# Patient Record
Sex: Male | Born: 1980
Health system: Southern US, Community
[De-identification: ages and names within clinical notes are randomized; demographics above are authoritative.]

## PROBLEM LIST (undated history)

## (undated) DIAGNOSIS — G479 Sleep disorder, unspecified: Secondary | ICD-10-CM

## (undated) DIAGNOSIS — F419 Anxiety disorder, unspecified: Secondary | ICD-10-CM

## (undated) HISTORY — DX: Anxiety disorder, unspecified: F41.9

## (undated) HISTORY — DX: Sleep disorder, unspecified: G47.9

---

## 1999-02-15 ENCOUNTER — Encounter: Payer: Self-pay | Admitting: Internal Medicine

## 1999-02-15 ENCOUNTER — Emergency Department (HOSPITAL_COMMUNITY): Admission: EM | Admit: 1999-02-15 | Discharge: 1999-02-15 | Payer: Self-pay | Admitting: Internal Medicine

## 2000-12-31 ENCOUNTER — Emergency Department (HOSPITAL_COMMUNITY): Admission: EM | Admit: 2000-12-31 | Discharge: 2000-12-31 | Payer: Self-pay | Admitting: Emergency Medicine

## 2000-12-31 ENCOUNTER — Encounter: Payer: Self-pay | Admitting: Emergency Medicine

## 2005-02-23 ENCOUNTER — Emergency Department (HOSPITAL_COMMUNITY): Admission: EM | Admit: 2005-02-23 | Discharge: 2005-02-24 | Payer: Self-pay | Admitting: Emergency Medicine

## 2011-06-22 ENCOUNTER — Encounter: Payer: Self-pay | Admitting: Internal Medicine

## 2011-06-22 ENCOUNTER — Ambulatory Visit (INDEPENDENT_AMBULATORY_CARE_PROVIDER_SITE_OTHER): Payer: PRIVATE HEALTH INSURANCE | Admitting: Internal Medicine

## 2011-06-22 ENCOUNTER — Telehealth: Payer: Self-pay | Admitting: Internal Medicine

## 2011-06-22 VITALS — BP 138/82 | HR 74 | Ht 75.75 in | Wt 195.8 lb

## 2011-06-22 DIAGNOSIS — F5104 Psychophysiologic insomnia: Secondary | ICD-10-CM | POA: Insufficient documentation

## 2011-06-22 DIAGNOSIS — G4700 Insomnia, unspecified: Secondary | ICD-10-CM

## 2011-06-22 MED ORDER — CLONAZEPAM 0.5 MG PO TABS: ORAL_TABLET | ORAL | Status: DC

## 2011-06-22 NOTE — Patient Instructions (Signed)
Allow for exercise during day to burn off nervous energy.  Avoid caffeine after lunch Wind down in the evening as bedtime approaches- read, avoid bright lights, etc. Best sleeping conditions are usually in a quiet, dark, cool bedroom with a comfortable bed. If you really can't sleep and are getting frustrated, then get up, go into a different room and do something distracting until you feel ready to sleep again.   Consider an online source of instruction in cognitive behavioral therapy for sleep : cbtforinsomnia.com or Sleepio.com  Script for trial clonazepam.    Continue citalopram for now  Go ahead and check out the [employer] job.

## 2011-06-22 NOTE — Telephone Encounter (Signed)
Pt wanted to know if he needed to continue or d/c clorazepate. PER CY pt to d/c Clorazepate, continue Citalopram, and trial clonazepam. Pt verbalized understanding

## 2011-06-22 NOTE — Progress Notes (Signed)
Subjective:    Patient ID: Chad Burke, male    DOB: August 23, 1981, 31 y.o.   MRN: [id number]  HPI 06/22/11- 30 yo male never smoker seen for complaint of difficulty initiating and maintaining sleep, on the kind referral of Dr. Daisy Floro He says this has been a problem for 2 years without specific onset or triggering event and with no childhood or adolescent sleep problems initially he had difficulty falling asleep and then progressively over time began having more trouble maintaining sleep, regaining sleep once awake, and daytime tiredness. Now he wakes between 3 and 4 AM. Bedtime is 11 PM to 12 midnight. He may drink tea at lunch and dinner but not coffee. He feels he cannot nap, lies there but sleep will not come. Denies fighting sleepiness while driving. He has tried and failed temazepam, trazodone 3 mg Lunesta samples. He continues Citalopram and Tranxene. Never felt that anxiety was a problem. Does not think he has significant apnea limb jerks or other sleep disturbing behavior. Occasional social alcohol. Stopped smoking 2008. He is unmarried unemployed and living with his parents. 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 Family history the mother has some insomnia. Thyroid function has tested normal and he denies weight change palpitation or cream or.  Review of Systems Constitutional:   No-   weight loss, night sweats, fevers, chills, fatigue, lassitude. HEENT:   No-  headaches, difficulty swallowing, tooth/dental problems, sore throat,       No-  sneezing, itching, ear ache, nasal congestion, post nasal drip,  CV:  No-   chest pain, orthopnea, PND, swelling in lower extremities, anasarca, dizziness, palpitations Resp: No-   shortness of breath with exertion or at rest.              No-   productive cough,  No non-productive cough,  No-  coughing up of blood.              No-   change in color of  mucus.  No- wheezing.   Skin: No-   rash or lesions. GI:  No-   heartburn, indigestion, abdominal pain, nausea, vomiting, diarrhea,                 change in bowel habits, loss of appetite GU: No-   dysuria, change in color of urine, no urgency or frequency.  No- flank pain. MS:  No-   joint pain or swelling.  No- decreased range of motion.  No- back pain. Neuro- grossly normal to observation, Or:  Psych:  No- change in mood or affect. Mild depression or anxiety about work capacity if he doesn't get enough sleep..  No memory loss.      Objective:   Physical Exam General- Alert, Oriented, Affect-appropriate, Distress- none acute,    Tall, lean Skin- rash-none, lesions- none, excoriation- none Lymphadenopathy- none Head- atraumatic            Eyes- Gross vision intact, PERRLA, conjunctivae clear secretions            Ears- Hearing, canals normal            Nose- Clear, no-Septal dev, mucus, polyps, erosion, perforation             Throat- Mallampati II , mucosa clear , drainage- none, tonsils- atrophic Neck- flexible , trachea midline, no stridor , thyroid nl, carotid no bruit Chest - symmetrical excursion ,  unlabored           Heart/CV- RRR , no murmur , no gallop  , no rub, nl s1 s2                           - JVD- none , edema- none, stasis changes- none, varices- none           Lung- clear to P&A, wheeze- none, cough- none , dullness-none, rub- none           Chest wall-  Abd- tender-no, distended-no, bowel sounds-present, HSM- no Br/ Gen/ Rectal- Not done, not indicated Extrem- cyanosis- none, clubbing, none, atrophy- none, strength- nl Neuro- grossly intact to observation         Assessment & Plan:

## 2011-06-22 NOTE — Assessment & Plan Note (Addendum)
Chronic nonspecific insomnia with psychophysiologic features. We discussed and will direct him to a cbt site and try clonazepam.  Educated about good sleep hygiene.  He gets performance anxiety as bedtime comes close and in that sense may be his "worst enemy".

## 2011-06-23 ENCOUNTER — Encounter: Payer: Self-pay | Admitting: Internal Medicine

## 2011-06-28 ENCOUNTER — Telehealth: Payer: Self-pay | Admitting: Internal Medicine

## 2011-06-28 NOTE — Telephone Encounter (Signed)
Per CY-okay for patient to try taking up to 4 0.5mg  tablets of clonazepam for sleep at night if needed. Pt is aware and will call back if any troubles.

## 2011-06-28 NOTE — Telephone Encounter (Addendum)
Called and spoke with pt. He states still having diff with sleep. He has been taking clonazepam 0.5 mg 2 at bedtime and states that this helps him fall asleep, but then he wakes up a few hours later and can not fall back asleep easily. He states that taking just 1 tablet did not help at all. He states that he has tried following with behavioral changes also and this is not helping- has cut out the caffeine after lunch and has been exercising daily. Dr Maple Hudson, pls advise recs thanks!

## 2011-07-01 ENCOUNTER — Telehealth: Payer: Self-pay | Admitting: Internal Medicine

## 2011-07-01 MED ORDER — CLONAZEPAM 1 MG PO TABS: 2.0000 mg | ORAL_TABLET | Freq: Every evening | ORAL | Status: DC | PRN

## 2011-07-01 NOTE — Telephone Encounter (Signed)
Per CY-okay to give Clonazepam 1mg  #60 take 2 po qhs prn sleep with 5 refills.     Spoke with patient-aware of Rx called in to pharmacy.

## 2011-07-01 NOTE — Telephone Encounter (Signed)
Pt called on 8/20 and was instructed to try 4 tablets of the clonazepam 0.5mg  tablets qhs to see if this helped him sleep.  Pt states he has been taking the 4 tablets for the last 2 nights which is working well.  Last night he slept for at least 7 hours - states he hasn't slept this long in a long time.  He has only 6 tabs left of the 0.5 mg and is requesting rx for clonazepam 1mg  take 2 po qhs.  [facility] [address].  Dr. Maple Hudson, is this ok?

## 2011-08-12 ENCOUNTER — Ambulatory Visit (INDEPENDENT_AMBULATORY_CARE_PROVIDER_SITE_OTHER): Payer: PRIVATE HEALTH INSURANCE | Admitting: Internal Medicine

## 2011-08-12 ENCOUNTER — Encounter: Payer: Self-pay | Admitting: Internal Medicine

## 2011-08-12 VITALS — BP 122/68 | HR 67 | Ht 76.0 in | Wt 198.0 lb

## 2011-08-12 DIAGNOSIS — F5104 Psychophysiologic insomnia: Secondary | ICD-10-CM

## 2011-08-12 DIAGNOSIS — G4700 Insomnia, unspecified: Secondary | ICD-10-CM

## 2011-08-12 NOTE — Patient Instructions (Signed)
OK to try advancing clonazepam 1 mg tabs to take 2-3 tabs as needed for sleep.  Please call as needed

## 2011-08-12 NOTE — Progress Notes (Signed)
Patient ID: Chad Burke, male    DOB: 27-Jan-1981, 30 y.o.   MRN: [id number]  HPI 06/22/11- 30 yo male never smoker seen for complaint of difficulty initiating and maintaining sleep, on the kind referral of Dr. Daisy Floro He says this has been a problem for 2 years without specific onset or triggering event and with no childhood or adolescent sleep problems initially he had difficulty falling asleep and then progressively over time began having more trouble maintaining sleep, regaining sleep once awake, and daytime tiredness. Now he wakes between 3 and 4 AM. Bedtime is 11 PM to 12 midnight. He may drink tea at lunch and dinner but not coffee. He feels he cannot nap, lies there but sleep will not come. Denies fighting sleepiness while driving. He has tried and failed temazepam, trazodone 3 mg Lunesta samples. He continues Citalopram and Tranxene. Never felt that anxiety was a problem. Does not think he has significant apnea limb jerks or other sleep disturbing behavior. Occasional social alcohol. Stopped smoking 2008. He is unmarried unemployed and living with his parents. 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 Family history the mother has some insomnia. Thyroid function has tested normal and he denies weight change palpitation or tremor.  08/12/11-  30 yo male never smoker seen for complaint of difficulty initiating and maintaining sleep,   PCP Dr C.A. Tenny Craw He reports that taking Flonase a patent 2 mg is better for sleep but he still does not feel he sleeps deeply and wakes somewhat unrested. Sleep onset and maintenance now seem okay. 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 That has been a big social stressor for him, and once resolved he feels may sleep better. He did not try the online course for cognitive behavioral therapy as he "wasn't interested in paying for a website". He has a  little warning drowsiness but it does not interfere. We discussed this in terms of the half-life of clonazepam he denies headache daytime sleepiness or confusion.   Review of Systems Constitutional:   No-   weight loss, night sweats, fevers, chills, fatigue, lassitude. HEENT:   No-  headaches, difficulty swallowing, tooth/dental problems, sore throat,       No-  sneezing, itching, ear ache, nasal congestion, post nasal drip,  CV:  No-   chest pain, orthopnea, PND, swelling in lower extremities, anasarca, dizziness, palpitations Resp: No-   shortness of breath with exertion or at rest.              No-   productive cough,  No non-productive cough,  No-  coughing up of blood.              No-   change in color of mucus.  No- wheezing.   Skin: No-   rash or lesions. GI:  No-   heartburn, indigestion, abdominal pain, nausea, vomiting, diarrhea,                 change in bowel habits, loss of appetite GU: No-   dysuria, change in color of urine, no urgency or frequency.  No- flank pain. MS:  No-   joint pain or swelling.  No- decreased range of motion.  No- back pain. Neuro- grossly normal to observation, Or:  Psych:  No- change in mood or affect. Mild depression or anxiety about work capacity if he doesn't get enough sleep.Marland Kitchen  No memory loss.      Objective:   Physical Exam General- Alert, Oriented, Affect-appropriate-calm, and articulate, Distress- none acute,    Tall, lean. Skin- rash-none, lesions- none, excoriation- none Lymphadenopathy- none Head- atraumatic            Eyes- Gross vision intact, PERRLA, conjunctivae clear secretions            Ears- Hearing, canals normal            Nose- Clear, no-Septal dev, mucus, polyps, erosion, perforation             Throat- Mallampati II , mucosa clear , drainage- none, tonsils- atrophic Neck- flexible , trachea midline, no stridor , thyroid nl, carotid no bruit Chest - symmetrical excursion , unlabored           Heart/CV- RRR , no murmur ,  no gallop  , no rub, nl s1 s2                           - JVD- none , edema- none, stasis changes- none, varices- none           Lung- clear to P&A, wheeze- none, cough- none , dullness-none, rub- none           Chest wall-  Abd- tender-no, distended-no, bowel sounds-present, HSM- no Br/ Gen/ Rectal- Not done, not indicated Extrem- cyanosis- none, clubbing, none, atrophy- none, strength- nl Neuro- grossly intact to observation

## 2011-08-15 NOTE — Assessment & Plan Note (Signed)
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

## 2011-09-13 ENCOUNTER — Telehealth: Payer: Self-pay | Admitting: Internal Medicine

## 2011-09-13 MED ORDER — CLONAZEPAM 1 MG PO TABS: ORAL_TABLET | ORAL | Status: DC

## 2011-09-13 NOTE — Telephone Encounter (Signed)
Ok to Rx Clonazepam 1 mg, # 90,  1-3 as needed for sleep, ref x 2

## 2011-09-13 NOTE — Telephone Encounter (Signed)
Called, spoke with pt.  He is aware clonazepam 1 mg rx will be called into pharmacy for 1-3 prn for sleep.  He verbalized understanding of this.    Rx called into [facility] at [facility] on [address]

## 2011-09-13 NOTE — Telephone Encounter (Signed)
I spoke with pt and he states at last OV Dr. Maple Hudson advised him he can take his clonazepam 1 mg up to 3 tablets at bedtime. Pt states he did not get a new rx for this and has ran out. Pt would like new rx sent to [facility] on [address] Please advise Dr. Maple Hudson, thanks  Carver Fila, CMA

## 2011-09-23 ENCOUNTER — Ambulatory Visit (INDEPENDENT_AMBULATORY_CARE_PROVIDER_SITE_OTHER): Payer: PRIVATE HEALTH INSURANCE | Admitting: Internal Medicine

## 2011-09-23 ENCOUNTER — Encounter: Payer: Self-pay | Admitting: Internal Medicine

## 2011-09-23 VITALS — BP 120/68 | HR 61 | Ht 76.0 in | Wt 204.8 lb

## 2011-09-23 DIAGNOSIS — G4700 Insomnia, unspecified: Secondary | ICD-10-CM

## 2011-09-23 DIAGNOSIS — F5104 Psychophysiologic insomnia: Secondary | ICD-10-CM

## 2011-09-23 MED ORDER — CLONAZEPAM 1 MG PO TABS: ORAL_TABLET | ORAL | Status: DC

## 2011-09-23 NOTE — Patient Instructions (Signed)
We will phone in refill script for clonazepam  Please call as needed  As you find able, try to gradually reduce and stop the clonazepam.

## 2011-09-23 NOTE — Progress Notes (Signed)
Patient ID: Chad Burke, male    DOB: 07-21-1981, 30 y.o.   MRN: [id number]  HPI 06/22/11- 30 yo male never smoker seen for complaint of difficulty initiating and maintaining sleep, on the kind referral of Dr. Daisy Floro He says this has been a problem for 2 years without specific onset or triggering event and with no childhood or adolescent sleep problems initially he had difficulty falling asleep and then progressively over time began having more trouble maintaining sleep, regaining sleep once awake, and daytime tiredness. Now he wakes between 3 and 4 AM. Bedtime is 11 PM to 12 midnight. He may drink tea at lunch and dinner but not coffee. He feels he cannot nap, lies there but sleep will not come. Denies fighting sleepiness while driving. He has tried and failed temazepam, trazodone 3 mg Lunesta samples. He continues Citalopram and Tranxene. Never felt that anxiety was a problem. Does not think he has significant apnea limb jerks or other sleep disturbing behavior. Occasional social alcohol. Stopped smoking 2008. He is unmarried unemployed and living with his parents. 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 Family history the mother has some insomnia. Thyroid function has tested normal and he denies weight change palpitation or tremor.  08/12/11-  30 yo male never smoker seen for complaint of difficulty initiating and maintaining sleep,   PCP Dr C.A. Tenny Craw He reports that taking Flonase a patent 2 mg is better for sleep but he still does not feel he sleeps deeply and wakes somewhat unrested. Sleep onset and maintenance now seem okay. 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 That has been a big social stressor for him, and once resolved he feels may sleep better. He did not try the online course for cognitive behavioral therapy as he "wasn't interested in paying for a website". He has a  little warning drowsiness but it does not interfere. We discussed this in terms of the half-life of clonazepam he denies headache daytime sleepiness or confusion.  09/23/11-  30 yo male never smoker seen for complaint of difficulty initiating and maintaining sleep,   PCP Dr C.A. Tenny Craw He reports he is feeling better and sleeping better. Still occasional daytime tiredness. Using clonazepam, 3 mg was too much so he uses 2 mg at bedtime continuing psych followup. 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 He recognizes that stress over the job issue is an important disrupter of his sleep.  Review of Systems Constitutional:   No-   weight loss, night sweats, fevers, chills, fatigue, lassitude. HEENT:   No-  headaches, difficulty swallowing, tooth/dental problems, sore throat,       No-  sneezing, itching, ear ache, nasal congestion, post nasal drip,  CV:  No-   chest pain, orthopnea, PND, swelling in lower extremities, anasarca, dizziness, palpitations Resp: No-   shortness of breath with exertion or at rest.              No-   productive cough,  No non-productive cough,  No-  coughing up of blood.              No-   change in color of mucus.  No- wheezing.   Skin: No-   rash or lesions. GI:  No-   heartburn, indigestion, abdominal pain, nausea, vomiting, diarrhea,  change in bowel habits, loss of appetite GU: No-   dysuria, change in color of urine, no urgency or frequency.  No- flank pain. MS:  No-   joint pain or swelling.  No- decreased range of motion.  No- back pain. Neuro- grossly normal to observation, Or:  Psych:  No- change in mood or affect. Mild depression or anxiety about work capacity if he doesn't get enough sleep..  No memory loss.      Objective:   Physical Exam General- Alert, Oriented, Affect-appropriate-calm, and articulate, Distress- none acute,    Tall, lean. Skin- rash-none, lesions- none, excoriation-  none Lymphadenopathy- none Head- atraumatic            Eyes- Gross vision intact, PERRLA, conjunctivae clear secretions            Ears- Hearing, canals normal            Nose- Clear, no-Septal dev, mucus, polyps, erosion, perforation             Throat- Mallampati II , mucosa clear , drainage- none, tonsils- atrophic Neck- flexible , trachea midline, no stridor , thyroid nl, carotid no bruit Chest - symmetrical excursion , unlabored           Heart/CV- RRR , no murmur , no gallop  , no rub, nl s1 s2                           - JVD- none , edema- none, stasis changes- none, varices- none           Lung- clear to P&A, wheeze- none, cough- none , dullness-none, rub- none           Chest wall-  Abd- tender-no, distended-no, bowel sounds-present, HSM- no Br/ Gen/ Rectal- Not done, not indicated Extrem- cyanosis- none, clubbing, none, atrophy- none, strength- nl Neuro- grossly intact to observation

## 2011-09-26 NOTE — Assessment & Plan Note (Signed)
Chronic insomnia aggravated by job-related stress concerns. Clonazepam is effective and appropriate at this point.

## 2012-01-10 ENCOUNTER — Ambulatory Visit: Payer: Self-pay | Admitting: Physician Assistant

## 2012-01-12 ENCOUNTER — Other Ambulatory Visit: Payer: Self-pay | Admitting: Internal Medicine

## 2012-01-12 NOTE — Telephone Encounter (Signed)
Please advise if okay to refill; patient has appt scheduled for   03/23/2012 3:00 PM  Waymon Budge, MD  [facility]  None   Thanks.

## 2012-01-12 NOTE — Telephone Encounter (Signed)
Ok to refill clonazepam 

## 2012-01-14 ENCOUNTER — Telehealth: Payer: Self-pay | Admitting: Internal Medicine

## 2012-01-14 MED ORDER — CLONAZEPAM 1 MG PO TABS: ORAL_TABLET | ORAL | Status: DC

## 2012-01-14 NOTE — Telephone Encounter (Signed)
Pt aware and rx called in  

## 2012-01-14 NOTE — Telephone Encounter (Signed)
Spoke with pt requesting rx for klonopin 1mg   Take 1-3 tablets as needed for sleep  Call to  [facility] on [address]  Allergies  Allergen Reactions  . Codeine     GI upset   Dr young is this ok to fill  Please advise  Thank you

## 2012-01-14 NOTE — Telephone Encounter (Signed)
Per CY-okay to refill #90 5 refills.

## 2012-03-23 ENCOUNTER — Ambulatory Visit (INDEPENDENT_AMBULATORY_CARE_PROVIDER_SITE_OTHER): Payer: PRIVATE HEALTH INSURANCE | Admitting: Internal Medicine

## 2012-03-23 ENCOUNTER — Encounter: Payer: Self-pay | Admitting: Internal Medicine

## 2012-03-23 VITALS — BP 122/64 | HR 65 | Ht 76.0 in | Wt 210.2 lb

## 2012-03-23 DIAGNOSIS — G4700 Insomnia, unspecified: Secondary | ICD-10-CM

## 2012-03-23 DIAGNOSIS — F5104 Psychophysiologic insomnia: Secondary | ICD-10-CM

## 2012-03-23 NOTE — Patient Instructions (Signed)
Please call as needed  Consider trying 1 and 1/2 clonazepam tabs sometimes

## 2012-03-23 NOTE — Progress Notes (Signed)
Patient ID: Chad Burke, male    DOB: [birth year], 32 y.o.   MRN: [id number]  HPI 06/22/11- 30 yo male never smoker seen for complaint of difficulty initiating and maintaining sleep, on the kind referral of Dr. Daisy Floro He says this has been a problem for 2 years without specific onset or triggering event and with no childhood or adolescent sleep problems initially he had difficulty falling asleep and then progressively over time began having more trouble maintaining sleep, regaining sleep once awake, and daytime tiredness. Now he wakes between 3 and 4 AM. Bedtime is 11 PM to 12 midnight. He may drink tea at lunch and dinner but not coffee. He feels he cannot nap, lies there but sleep will not come. Denies fighting sleepiness while driving. He has tried and failed temazepam, trazodone 3 mg Lunesta samples. He continues Citalopram and Tranxene. Never felt that anxiety was a problem. Does not think he has significant apnea limb jerks or other sleep disturbing behavior. Occasional social alcohol. Stopped smoking 2008. He is unmarried unemployed and living with his parents. 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 Family history the mother has some insomnia. Thyroid function has tested normal and he denies weight change palpitation or tremor.  08/12/11-  31 yo male never smoker seen for complaint of difficulty initiating and maintaining sleep,   PCP Dr C.A. Tenny Craw He reports that taking Flonase a patent 2 mg is better for sleep but he still does not feel he sleeps deeply and wakes somewhat unrested. Sleep onset and maintenance now seem okay. 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 That has been a big social stressor for him, and once resolved he feels may sleep better. He did not try the online course for cognitive behavioral therapy as he "wasn't interested in paying for a website". He has a  little warning drowsiness but it does not interfere. We discussed this in terms of the half-life of clonazepam he denies headache daytime sleepiness or confusion.  09/23/11-  32 yo male never smoker seen for complaint of difficulty initiating and maintaining sleep,   PCP Dr C.A. Tenny Craw He reports he is feeling better and sleeping better. Still occasional daytime tiredness. Using clonazepam, 3 mg was too much so he uses 2 mg at bedtime continuing psych followup. 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 He recognizes that stress over the job issue is an important disrupter of his sleep.  03/23/12- 31 yo male never smoker seen for complaint of difficulty initiating and maintaining sleep,   PCP Dr C.A. 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 Has been sleeping better. Some nights he is restless. He sleeps less well on those days when he has exercised more. Never needs more than 2 clonazepam and we discussed how to split the tablet. I offered referral to clinical psychologists who work with cognitive behavioral therapy.  Review of Systems Constitutional:   No-   weight loss, night sweats, fevers, chills, fatigue, lassitude. HEENT:   No-  headaches, difficulty swallowing, tooth/dental problems, sore throat,       No-  sneezing, itching, ear ache, nasal congestion, post nasal drip,  CV:  No-   chest pain, orthopnea, PND, swelling in lower extremities,  anasarca, dizziness, palpitations Resp: No-   shortness of breath with exertion or at rest.              No-   productive cough,  No non-productive cough,  No-  coughing up of blood.              No-   change in color of mucus.  No- wheezing.   Skin: No-   rash or lesions. GI:  No-   heartburn, indigestion, abdominal pain, nausea, vomiting, GU:  MS:  No-   joint pain or swelling.   Neuro-  nothing unusual Psych:  No- change in mood or affect. Mild depression or anxiety about work capacity if he doesn't get enough sleep..  No memory loss.   Objective:   Physical Exam General- Alert, Oriented, Affect-appropriate-calm, and articulate, Distress- none acute,    Tall, lean. Skin- rash-none, lesions- none, excoriation- none Lymphadenopathy- none Head- atraumatic            Eyes- Gross vision intact, PERRLA, conjunctivae clear secretions            Ears- Hearing, canals normal            Nose- Clear, no-Septal dev, mucus, polyps, erosion, perforation             Throat- Mallampati II , mucosa clear , drainage- none, tonsils- atrophic Neck- flexible , trachea midline, no stridor , thyroid nl, carotid no bruit Chest - symmetrical excursion , unlabored           Heart/CV- RRR , no murmur , no gallop  , no rub, nl s1 s2                           - JVD- none , edema- none, stasis changes- none, varices- none           Lung- clear to P&A, wheeze- none, cough- none , dullness-none, rub- none           Chest wall-  Abd-  Br/ Gen/ Rectal- Not done, not indicated Extrem- cyanosis- none, clubbing, none, atrophy- none, strength- nl Neuro- grossly intact to observation

## 2012-03-28 NOTE — Assessment & Plan Note (Signed)
Anxiety about his job situation is a major disturb her of his sleep. That is likely not to be a permanent issue. Plan-try reducing clonazepam from 2 tablets to one and a half tablets at night when possible.

## 2012-05-18 ENCOUNTER — Ambulatory Visit: Payer: PRIVATE HEALTH INSURANCE

## 2012-05-18 ENCOUNTER — Ambulatory Visit (INDEPENDENT_AMBULATORY_CARE_PROVIDER_SITE_OTHER): Payer: PRIVATE HEALTH INSURANCE | Admitting: Emergency Medicine

## 2012-05-18 VITALS — BP 126/64 | HR 54 | Temp 98.3°F | Resp 18 | Ht 74.5 in | Wt 210.0 lb

## 2012-05-18 DIAGNOSIS — R079 Chest pain, unspecified: Secondary | ICD-10-CM

## 2012-05-18 NOTE — Progress Notes (Signed)
Subjective:    Patient ID: Chad Burke, male    DOB: 10-22-1981, 31 y.o.   MRN: [id number]  HPI Comments: Awoke from sleep SUnday with aching in his right anterior chest.  Present unchanged since.  Not affected by activitiy including jogging.  Chest Pain  This is a new problem. The current episode started in the past 7 days. The onset quality is sudden. The problem occurs constantly. The problem has been unchanged. The pain is present in the lateral region. The pain is at a severity of 3/10. The pain is moderate. The quality of the pain is described as numbness. The pain does not radiate. Pertinent negatives include no abdominal pain, back pain, claudication, cough, diaphoresis, dizziness, exertional chest pressure, fever, headaches, hemoptysis, irregular heartbeat, leg pain, lower extremity edema, malaise/fatigue, nausea, near-syncope, numbness, orthopnea, palpitations, PND, shortness of breath, sputum production, syncope, vomiting or weakness. The pain is aggravated by movement. He has tried NSAIDs for the symptoms. The treatment provided no relief. Risk factors include male gender, smoking/tobacco exposure and stress.  Pertinent negatives for past medical history include no aneurysm, no anxiety/panic attacks, no aortic aneurysm, no aortic dissection, no arrhythmia, no bicuspid aortic valve, no CAD, no cancer, no congenital heart disease, no connective tissue disease, no COPD, no CHF, no diabetes, no DVT, no hyperhomocysteinemia, no hyperlipidemia, no hypertension, no Kawasaki disease, no Marfan's syndrome, no MI, no mitral valve prolapse, no pacemaker, no PE, no PVD, no recent injury, no rheumatic fever, no seizures, no sickle cell disease, no sleep apnea, no spontaneous pneumothorax, no stimulant use, no strokes, no thyroid problem, no TIA, Turner syndrome and no valve disorder.  Pertinent negatives for family medical history include: family history of aortic dissection, no CAD in family, no  connective tissue disease in family, no diabetes in family, no heart disease in family, no hyperlipidemia in family, no hypertension in family, no Marfan's syndrome in family, no early MI in family, no PVD in family, no sickle cell disease in family, no stroke in family, no sudden death in family and no TIA in family.      Review of Systems  Constitutional: Negative for fever, malaise/fatigue and diaphoresis.  HENT: Negative.   Eyes: Negative.   Respiratory: Negative for cough, hemoptysis, sputum production and shortness of breath.   Cardiovascular: Positive for chest pain. Negative for palpitations, orthopnea, claudication, syncope, PND and near-syncope.  Gastrointestinal: Negative.  Negative for nausea, vomiting and abdominal pain.  Genitourinary: Negative.   Musculoskeletal: Negative for back pain.  Neurological: Negative for dizziness, seizures, weakness, numbness and headaches.       Objective:   Physical Exam  Nursing note and vitals reviewed. Constitutional: He is oriented to person, place, and time. He appears well-developed and well-nourished.  HENT:  Head: Atraumatic.  Eyes: Conjunctivae are normal. Pupils are equal, round, and reactive to light.  Neck: Normal range of motion. Neck supple. No tracheal deviation present. No thyromegaly present.  Cardiovascular: Normal rate, regular rhythm and normal heart sounds.   Pulmonary/Chest: Effort normal. He has no wheezes. He has no rales. He exhibits no tenderness.  Abdominal: Soft.  Musculoskeletal: Normal range of motion.  Lymphadenopathy:    He has no cervical adenopathy.  Neurological: He is alert and oriented to person, place, and time.  Skin: Skin is warm and dry.          Assessment & Plan:  Atypical chest pain Normal EKG and chest NSAID and see how things progress RTC PRN  [facility]  reading (PRIMARY) by  Dr. Dareen Piano.  Normal chest.

## 2012-08-20 ENCOUNTER — Other Ambulatory Visit: Payer: Self-pay | Admitting: Internal Medicine

## 2012-08-21 ENCOUNTER — Telehealth: Payer: Self-pay | Admitting: Internal Medicine

## 2012-08-21 MED ORDER — CLONAZEPAM 1 MG PO TABS: ORAL_TABLET | ORAL | Status: DC

## 2012-08-21 NOTE — Telephone Encounter (Signed)
last visit 03/13/12, next rov on  09-26-12. Last rx written on 3-13. Refill sent. Pt is aware. Carron Curie, CMA

## 2012-09-25 ENCOUNTER — Ambulatory Visit: Payer: PRIVATE HEALTH INSURANCE | Admitting: Internal Medicine

## 2012-09-26 ENCOUNTER — Ambulatory Visit (INDEPENDENT_AMBULATORY_CARE_PROVIDER_SITE_OTHER): Payer: PRIVATE HEALTH INSURANCE | Admitting: Internal Medicine

## 2012-09-26 ENCOUNTER — Encounter: Payer: Self-pay | Admitting: Internal Medicine

## 2012-09-26 VITALS — BP 126/78 | HR 72 | Ht 76.0 in | Wt 218.4 lb

## 2012-09-26 DIAGNOSIS — F5104 Psychophysiologic insomnia: Secondary | ICD-10-CM

## 2012-09-26 DIAGNOSIS — G4700 Insomnia, unspecified: Secondary | ICD-10-CM

## 2012-09-26 MED ORDER — CLONAZEPAM 1 MG PO TABS: ORAL_TABLET | ORAL | Status: DC

## 2012-09-26 NOTE — Patient Instructions (Addendum)
Because clonazepam lasts relatively long in your system ("long half-life"), it may contribute to morning tiredness. You can experiment with taking 1.5 tabs, with taking your whole dose 1/2 hour before bedtime, or with taking 1 pill  30 minutes before bed and the other at bedtime.   Script rewritten to last until you go back to see Dr Tenny Craw in April. He can take over your sleep management. I will be happy to see you again if needed.

## 2012-09-26 NOTE — Progress Notes (Signed)
Patient ID: Chad Burke, male    DOB: Aug 16, 1981, 31 y.o.   MRN: [id number]  HPI 06/22/11- 31 yo male never smoker seen for complaint of difficulty initiating and maintaining sleep, on the kind referral of Dr. Daisy Floro He says this has been a problem for 2 years without specific onset or triggering event and with no childhood or adolescent sleep problems initially he had difficulty falling asleep and then progressively over time began having more trouble maintaining sleep, regaining sleep once awake, and daytime tiredness. Now he wakes between 3 and 4 AM. Bedtime is 11 PM to 12 midnight. He may drink tea at lunch and dinner but not coffee. He feels he cannot nap, lies there but sleep will not come. Denies fighting sleepiness while driving. He has tried and failed temazepam, trazodone 3 mg Lunesta samples. He continues Citalopram and Tranxene. Never felt that anxiety was a problem. Does not think he has significant apnea limb jerks or other sleep disturbing behavior. Occasional social alcohol. Stopped smoking 2008. He is unmarried unemployed and living with his parents. 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 Family history the mother has some insomnia. Thyroid function has tested normal and he denies weight change palpitation or tremor.  08/12/11-  31 yo male never smoker seen for complaint of difficulty initiating and maintaining sleep,   PCP Dr C.A. Tenny Craw He reports that taking Flonase a patent 2 mg is better for sleep but he still does not feel he sleeps deeply and wakes somewhat unrested. Sleep onset and maintenance now seem okay. 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 That has been a big social stressor for him, and once resolved he feels may sleep better. He did not try the online course for cognitive behavioral therapy as he "wasn't interested in paying for a website". He has a  little warning drowsiness but it does not interfere. We discussed this in terms of the half-life of clonazepam he denies headache daytime sleepiness or confusion.  09/23/11-  31 yo male never smoker seen for complaint of difficulty initiating and maintaining sleep,   PCP Dr C.A. Tenny Craw He reports he is feeling better and sleeping better. Still occasional daytime tiredness. Using clonazepam, 3 mg was too much so he uses 2 mg at bedtime continuing psych followup. 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 He recognizes that stress over the job issue is an important disrupter of his sleep.  03/23/12- 31 yo male never smoker seen for complaint of difficulty initiating and maintaining sleep,   PCP Dr C.A. 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 Has been sleeping better. Some nights he is restless. He sleeps less well on those days when he has exercised more. Never needs more than 2 clonazepam and we discussed how to split the tablet. I offered referral to clinical psychologists who work with cognitive behavioral therapy.  09/26/12- 31 yo male never smoker seen for complaint of difficulty initiating and maintaining sleep,   PCP Dr C.A. Ross FOLLOWS FOR: has tried taking meds different ways-able to sleep through the night but feels tired and wakes too early. 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 Sleeps better but still needs 2 mg of clonazepam. Tried 1 mg per night x3 nights but slept poorly. Bedtime 10:30 PM. We discussed long half-life and morning carryover. He denies cataplexy or sleep paralysis and he denies irresistible naps. No problems with alertness while driving.  Review of Systems- see HPI Constitutional:   No-   weight loss, night sweats, fevers, chills, +fatigue,  lassitude. HEENT:   No-  headaches, difficulty swallowing, tooth/dental problems, sore throat,       No-  sneezing, itching, ear ache, nasal congestion, post nasal drip,  CV:  No-   chest pain, orthopnea, PND, swelling in lower extremities, anasarca, dizziness, palpitations Resp: No-   shortness of breath with exertion or at rest.              No-   productive cough,  No non-productive cough,  No-  coughing up of blood.              No-   change in color of mucus.  No- wheezing.   Skin: No-   rash or lesions. GI:  No-   heartburn, indigestion, abdominal pain, nausea, vomiting, GU:  MS:  No-   joint pain or swelling.   Neuro- nothing unusual Psych:  No- change in mood or affect. Mild depression or anxiety about work capacity if he doesn't get enough sleep..  No memory loss.   Objective:   Physical Exam General- Alert, Oriented, Affect-appropriate-calm, and articulate, Distress- none acute,    Tall, lean. Skin- rash-none, lesions- none, excoriation- none Lymphadenopathy- none Head- atraumatic            Eyes- Gross vision intact, PERRLA, conjunctivae clear secretions            Ears- Hearing, canals normal            Nose- Clear, no-Septal dev, mucus, polyps, erosion, perforation             Throat- Mallampati II , mucosa clear , drainage- none, tonsils- atrophic Neck- flexible , trachea midline, no stridor , thyroid nl, carotid no bruit Chest - symmetrical excursion , unlabored           Heart/CV- RRR , no murmur , no gallop  , no rub, nl s1 s2                           - JVD- none , edema- none, stasis changes- none, varices- none           Lung- clear to P&A, wheeze- none, cough- none , dullness-none, rub- none           Chest wall-  Abd-  Br/ Gen/ Rectal- Not done, not indicated Extrem- cyanosis- none, clubbing, none, atrophy- none, strength- nl Neuro- grossly intact to observation

## 2012-10-03 ENCOUNTER — Telehealth: Payer: Self-pay | Admitting: Internal Medicine

## 2012-10-03 MED ORDER — CLONAZEPAM 1 MG PO TABS: ORAL_TABLET | ORAL | Status: DC

## 2012-10-03 NOTE — Telephone Encounter (Signed)
I have spoken to a pharmacist at [facility] and she assures that they "lost" the prescription due to moving. I have gave them a verbal order to fill this for the pt.

## 2012-10-04 NOTE — Assessment & Plan Note (Addendum)
Doing well with clonazepam 2 mg. We discussed this medicine again. Not clear if depression is causing tiredness or tiredness is causing depression. I do not think he has primary hypersomnia.  He can try altering what time he takes his clonazepam. For instance could take 1 mg a half hour before bedtime and another milligram at bedtime. This would spread out peak blood level and reduce morning blood level. Since he feels the main issues are now clarified, we agreed that he could be followed and adjusted by Dr. Tenny Craw as his primary physician. I will be happy to see him again if needed

## 2013-05-07 ENCOUNTER — Other Ambulatory Visit: Payer: Self-pay | Admitting: Internal Medicine

## 2013-05-07 NOTE — Telephone Encounter (Signed)
Last seen 09-26-2012 told:  Follow-up and Disposition    Return if symptoms worsen or fail to improve.     Please advise if okay to refill. Thanks.

## 2013-05-07 NOTE — Telephone Encounter (Signed)
Is PCP able to renew and maintain his clonazepam script as we discussed last ov? Otherwise, we can refill it, but i would need to see him once/ year.

## 2013-05-08 ENCOUNTER — Telehealth: Payer: Self-pay | Admitting: Internal Medicine

## 2013-05-08 NOTE — Telephone Encounter (Signed)
Spoke with pt and he is seeing Dr Tenny Craw this afternoon and needs records faxed over.  Last 3 ov notes from Dr Maple Hudson sent.

## 2013-05-09 NOTE — Telephone Encounter (Signed)
Ok to refill 

## 2013-05-10 NOTE — Telephone Encounter (Signed)
Called refill to pharmacy voicemail.  

## 2013-07-19 IMAGING — CR DG CHEST 2V
4 series · 4 of 4 positions shown · non-contrast
Comparison: None.

CLINICAL DATA: Chest discomfort

CHEST - 2 VIEW

[PA (1 of 2)]
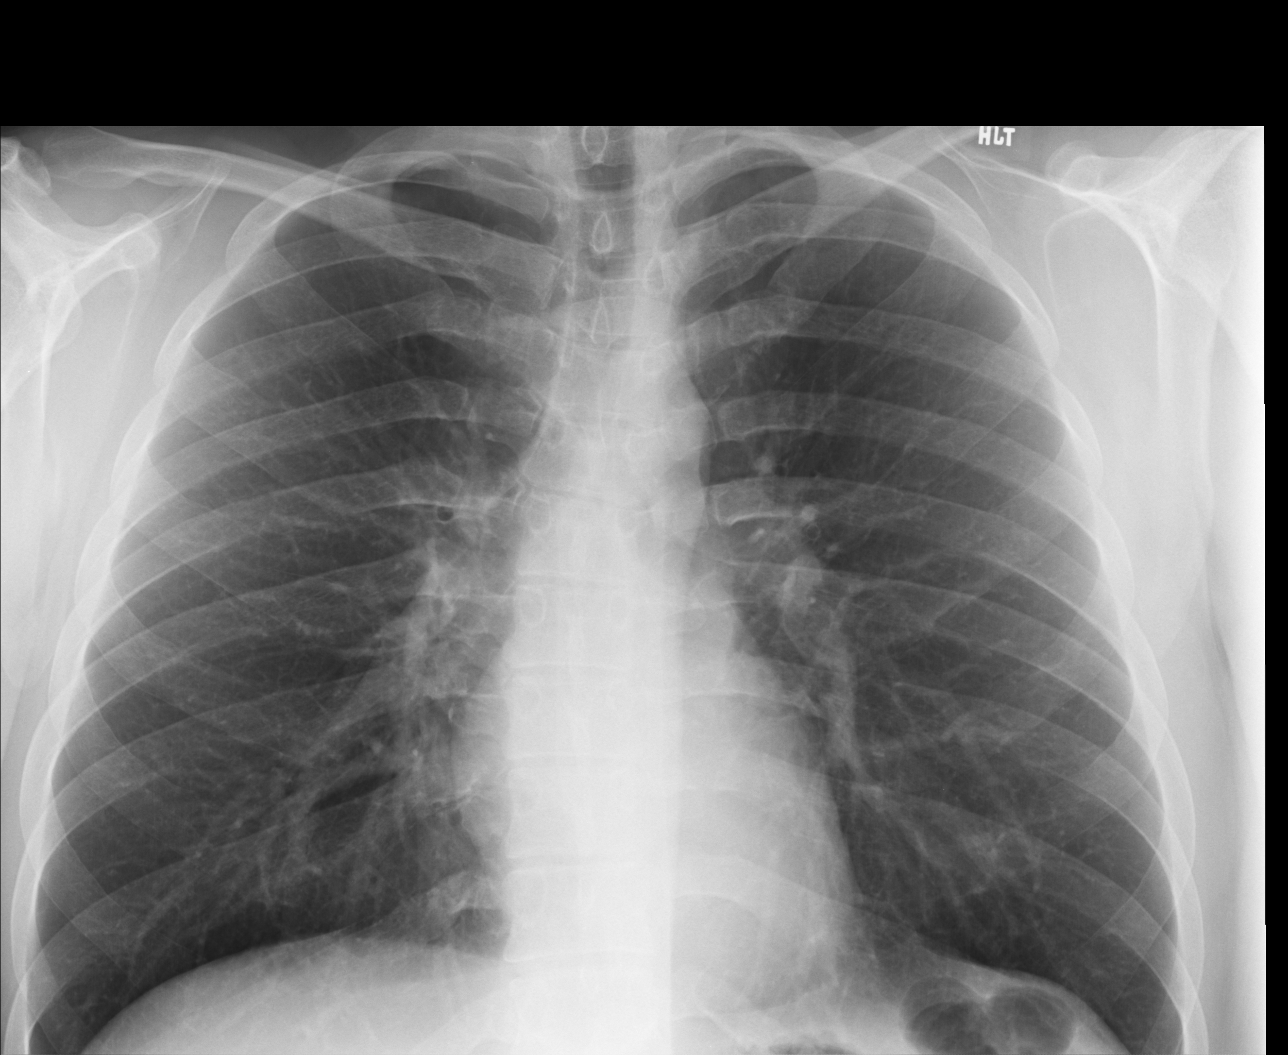

[lateral (1 of 2)]
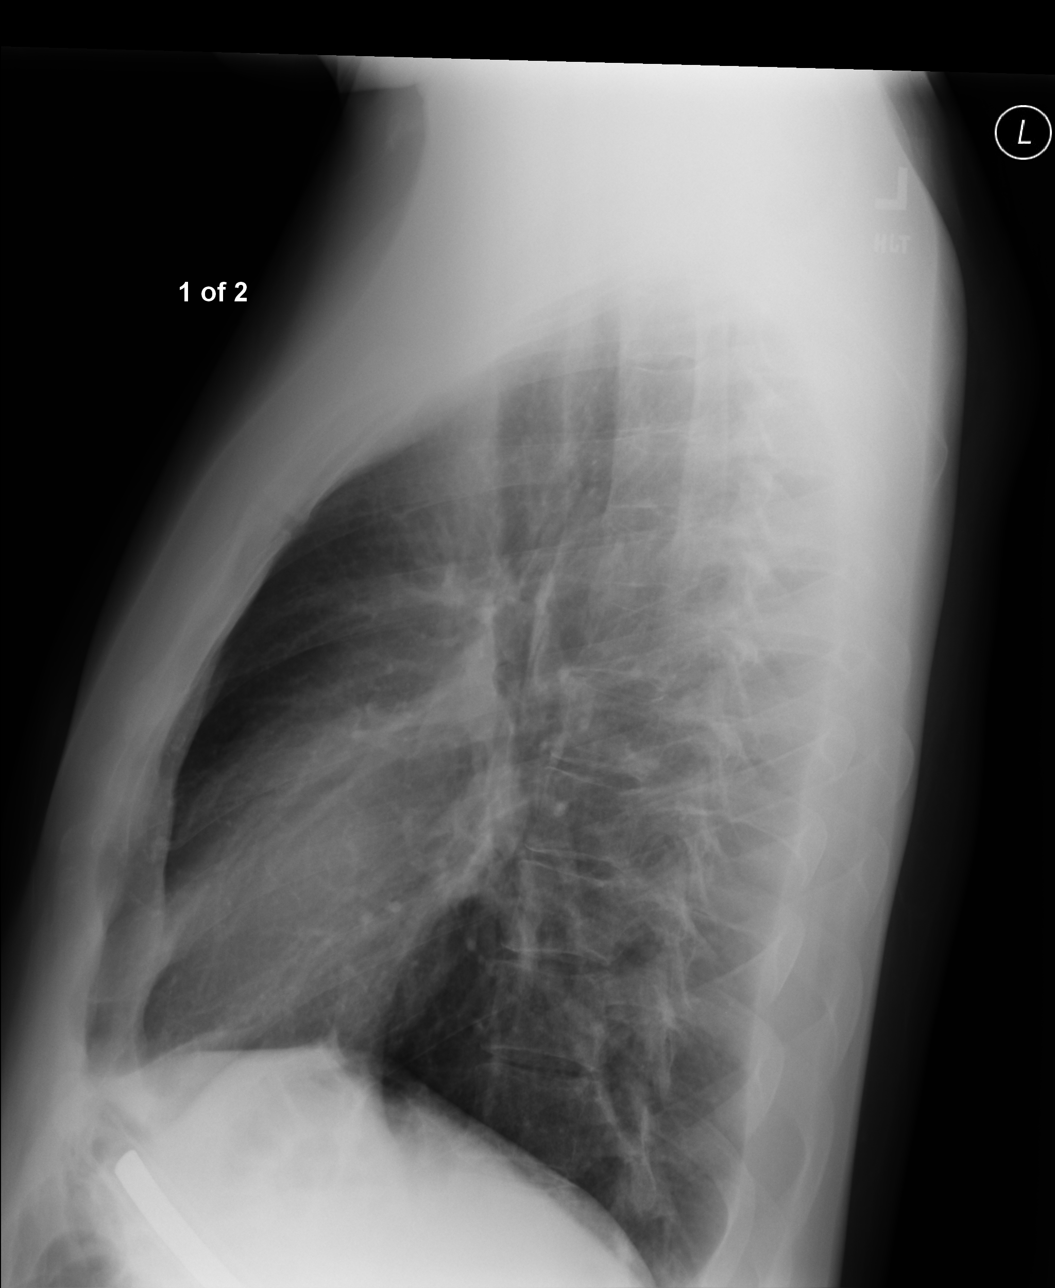

[PA (2 of 2)]
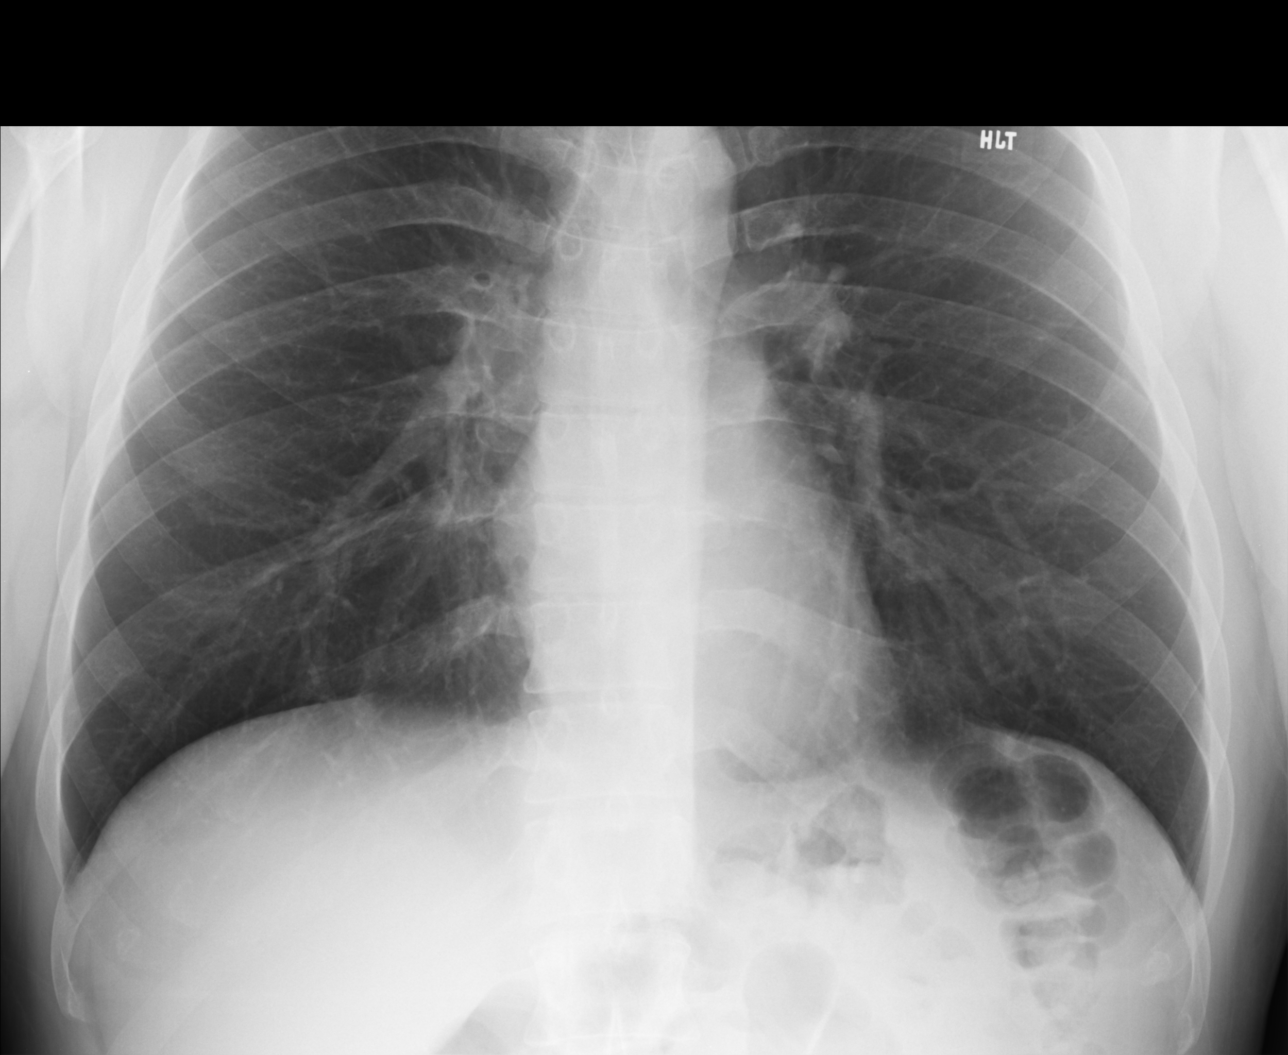

[lateral (2 of 2)]
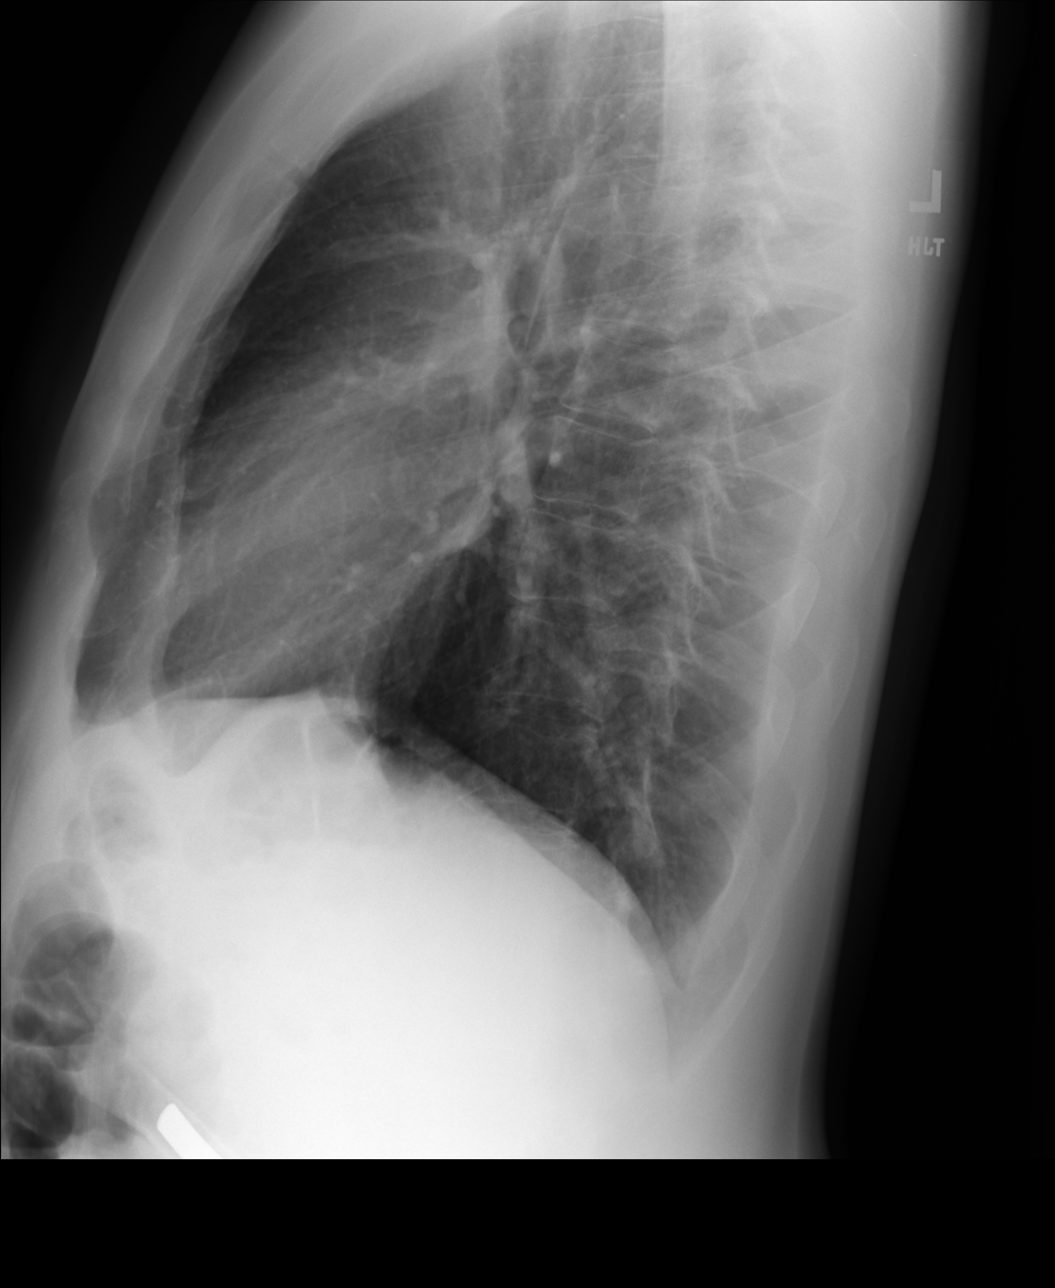

[4 of 4 positions shown; findings below may reference images not displayed]

FINDINGS: Normal heart size.  Bronchitic changes.  No consolidation
or mass.  No pneumothorax.  No pleural effusion.
IMPRESSION: No active cardiopulmonary disease.

Clinically significant discrepancy from primary report, if
provided: None

## 2013-09-30 ENCOUNTER — Other Ambulatory Visit: Payer: Self-pay | Admitting: Internal Medicine

## 2013-10-01 NOTE — Telephone Encounter (Signed)
Called refill to pharmacy voicemail.  

## 2013-10-01 NOTE — Telephone Encounter (Signed)
Please advise if okay to refill. Thanks.  

## 2013-10-01 NOTE — Telephone Encounter (Signed)
May refill for 2 months. Needs ROV to continue.

## 2013-11-08 ENCOUNTER — Other Ambulatory Visit: Payer: Self-pay | Admitting: Internal Medicine

## 2016-06-09 ENCOUNTER — Encounter: Payer: Self-pay | Admitting: Neurology

## 2016-06-09 ENCOUNTER — Ambulatory Visit (INDEPENDENT_AMBULATORY_CARE_PROVIDER_SITE_OTHER): Payer: BLUE CROSS/BLUE SHIELD | Admitting: Neurology

## 2016-06-09 VITALS — BP 143/92 | HR 70 | Resp 16 | Ht 75.0 in | Wt 224.0 lb

## 2016-06-09 DIAGNOSIS — F419 Anxiety disorder, unspecified: Secondary | ICD-10-CM

## 2016-06-09 DIAGNOSIS — R5383 Other fatigue: Secondary | ICD-10-CM | POA: Diagnosis not present

## 2016-06-09 DIAGNOSIS — R42 Dizziness and giddiness: Secondary | ICD-10-CM

## 2016-06-09 NOTE — Progress Notes (Signed)
Subjective:    Patient ID: Chad Burke is a 35 y.o. male.  HPI     Star Age, MD, PhD [facility] [address] [place], [place] [address]  Dear Dr. Rex Kras,   I saw your patient, Chad Burke, upon your kind request in my neurologic clinic today for initial consultation of his lightheadedness. The patient is unaccompanied today. As you know, [name] is a 35 year old right-handed gentleman with an underlying medical history of insomnia, and anxiety, followed by psychiatry, and overweight state, who reports intermittent lightheadedness and difficulty concentrating for the past 2 to 3 weeks. His main complaint is, however, flareup of anxiety. Anxiety has been a lifelong problem for him and he has been on multiple different medications and has been seeing a counselor as well as a psychiatry provider. He has an appointment tomorrow with his counselor. He used to see his counselor on a regular basis before but then stopped going. He may have had a concussion but is not fully sure what happened at work on 05/20/2016. He felt he had a bump on his left forehead but is not sure that he fell or whether he bumped it against the EMT truck. Nobody witnessed the event and he denies any loss of consciousness or headache. He rested throughout the weekend and on 05/24/2016 started feeling lightheaded, fatigued, and had mental fogginess and therefore went to urgent care. [facility] went to [facility]/24/2016 and I reviewed records which you also included. Vital signs are stable. He had a head CT without contrast on 05/24/2016 and I reviewed the results: Impression: No acute intracranial abnormality with subtle low-density in the left deep white matter and basal ganglia likely representing normal variant of prominent perivascular spaces.  I reviewed your office note from 05/26/16, which you kindly included. You ordered a brain MRI as well. He had a brain MRI with and  without contrast on 06/03/2016 and I reviewed the results: Developmental venous anomaly in the region of the left insular cortex. Otherwise normal MRI of the brain. In addition I personally reviewed the images on the CD he brought in today.  His current medications include Ambien ER 6.25 mg at night as needed, vitamin D 5000 units daily, sertraline 150 mg once daily, clonazepam 1 mg at night as needed.  He is single, has a girlfriend, no children, lives with his parents. He works for [employer]. He quit smoking in 2009, drinks 2-3 glasses of tea unsweetened daily, occasional alcohol, no illicit drugs, states he quit in 2008.  His Past Medical History Is Significant For: Past Medical History:  Diagnosis Date  . Anxiety   . Sleep difficulties     His Past Surgical History Is Significant For: No past surgical history on file.  His Family History Is Significant For: Family History  Problem Relation Age of Onset  . Diabetes Mother   . Hypertension Mother   . Anxiety disorder Mother   . Hypertension Father   . Hypothyroidism Sister     His Social History Is Significant For: Social History   Social History  . Marital status: Single    Spouse name: N/A  . Number of children: 0  . Years of education: GED   Occupational History  . [employer]    Social History Main Topics  . Smoking status: Former Smoker    Packs/day: 1.00    Years: 12.00    Types: Cigarettes    Quit date:  11/08/2005  . Smokeless tobacco: Never Used  . Alcohol use Yes     Comment: social  . Drug use: No  . Sexual activity: Not Asked   Other Topics Concern  . None   Social History Narrative   Exercise: gym, runs   Caffeine: tea, rare coffee     His Allergies Are:  Allergies  Allergen Reactions  . Codeine     GI upset  :   His Current Medications Are:  Outpatient Encounter Prescriptions as of 06/09/2016  Medication Sig  . clonazePAM (KLONOPIN) 1 MG tablet TAKE 1-3 TABLETS BY MOUTH EVERY NIGHT  AT BEDTIME AS NEEDED FOR SLEEP  . Melatonin-Pyridoxine (MELATONEX PO) Take by mouth.  . Protein POWD Take by mouth. 1 scoopful 1-3 days weekly  . sertraline (ZOLOFT) 100 MG tablet TK 1 AND 1/2 T PO QAM  . zolpidem (AMBIEN CR) 6.25 MG CR tablet TK 1 T PO QHS  . [DISCONTINUED] citalopram (CELEXA) 20 MG tablet Take 1 tablet by mouth Daily.   No facility-administered encounter medications on file as of 06/09/2016.   :   Review of Systems:  Out of a complete 14 point review of systems, all are reviewed and negative with the exception of these symptoms as listed belowReview of Systems  Neurological:       Patient states that he was working for [employer]  05/20/2016, does not remember what happened, but knew that he had bumped his head. Reports hematoma on L side of forehead. Finished shift and worked the rest of the weekend. Patient states that he felt fatigued afterward. On Monday noticed that he had less motivation and felt easily fatigued. Some light headedness and dizziness. No vision changes.     Objective:  Neurologic Exam  Physical Exam Physical Examination:   Vitals:   06/09/16 1440  BP: (!) 143/92  Pulse: 70  Resp: 16    General Examination: The patient is a very pleasant 35 y.o. male in no acute distress. He appears well-developed and well-nourished and well groomed. He is mildly anxious appearing. He denies any orthostatic lightheadedness. Orthostatic blood pressure and pulse values are physiological, sitting 129/88 with a pulse of 73, standing 136/88 with a pulse of 76. No vertigo as described.  HEENT: Normocephalic, atraumatic, pupils are equal, round and reactive to light and accommodation. Funduscopic exam is normal with sharp disc margins noted. Extraocular tracking is good without limitation to gaze excursion or nystagmus noted. Normal smooth pursuit is noted. Hearing is grossly intact. Face is symmetric with normal facial animation and normal facial sensation. Speech is clear  with no dysarthria noted. There is no hypophonia. There is no lip, neck/head, jaw or voice tremor. Neck is supple with full range of passive and active motion. There are no carotid bruits on auscultation. Oropharynx exam reveals: mild mouth dryness, good dental hygiene and mild airway crowdin.   Chest: Clear to auscultation without wheezing, rhonchi or crackles noted.  Heart: S1+S2+0, regular and normal without murmurs, rubs or gallops noted.   Abdomen: Soft, non-tender and non-distended with normal bowel sounds appreciated on auscultation.  Extremities: There is no pitting edema in the distal lower extremities bilaterally. Pedal pulses are intact.  Skin: Warm and dry without trophic changes noted. There are no varicose veins.  Musculoskeletal: exam reveals no obvious joint deformities, tenderness or joint swelling or erythema.   Neurologically:  Mental status: The patient is awake, alert and oriented in all 4 spheres. His immediate and remote memory, attention, language  skills and fund of knowledge are appropriate. There is no evidence of aphasia, agnosia, apraxia or anomia. Speech is clear with normal prosody and enunciation. Thought process is linear. Mood is normal and affect is blunted.  Cranial nerves II - XII are as described above under HEENT exam. In addition: shoulder shrug is normal with equal shoulder height noted. Motor exam: Normal bulk, strength and tone is noted. There is no drift, tremor or rebound. Romberg is negative. Reflexes are 2+ throughout. Babinski: Toes are flexor bilaterally. Fine motor skills and coordination: intact with normal finger taps, normal hand movements, normal rapid alternating patting, normal foot taps and normal foot agility.  Cerebellar testing: No dysmetria or intention tremor on finger to nose testing. Heel to shin is unremarkable bilaterally. There is no truncal or gait ataxia.  Sensory exam: intact to light touch, pinprick, vibration, temperature sense  in the upper and lower extremities.  Gait, station and balance: He stands easily. No veering to one side is noted. No leaning to one side is noted. Posture is age-appropriate and stance is narrow based. Gait shows normal stride length and normal pace. No problems turning are noted. Tandem walk is unremarkable.  Assessment and Plan:  Assessment and Plan:  In summary, RHYLIN NOSKA is a very pleasant 35 y.o.-year old male with an underlying medical history of insomnia, and anxiety, followed by psychiatry, and overweight state, who reports intermittent lightheadedness and difficulty concentrating for the past 2 to 3 weeks. His main complaint is, however, flareup of anxiety. His neurological exam is nonfocal, brain MRI from 06/03/2016 benign, and I reviewed the report as well as the scan. He also had a prior head CT last month with reassuring findings. His main issue is a lifelong problem with anxiety and recent flareup. I explained to him that anxiety is typically not a primary neurological problem and I do not see any neurological problems on exam, he is reassured in that regard repeatedly. He had routine blood work in March 2017 in your office. I would recommend making sure his thyroid function is okay, B12 level as well as vitamin D level. I advised him to continue to follow-up with his psychiatrist provider as well as his counselor. Sometimes seeing a counselor on a more frequent basis can help. I did advise him to keep good sleep hygiene and perhaps limit his caffeine intake in the form of tea a sometimes caffeine can trigger anxiety and also contribute to insomnia. Unfortunately there is no special scan for anxiety, as he inquired for any other scans that could show reasons for anxiety. At this juncture, I suggested an as needed follow up our mind of things. I answered all his good questions today and he was in agreement. Thank you very much for allowing me to participate in the care of this nice patient.  If I can be of any further assistance to you please do not hesitate to call me at [phone].  Sincerely,   Star Age, MD, PhD

## 2016-06-09 NOTE — Patient Instructions (Signed)
I am not sure how to explain your flare up in anxiety and your longer standing history of anxiety. I think it will be best to follow up with your psychiatrist and your counselor.   Thankfully, neurologically you look well and your brain MRI was good as well.   I will see you back as needed.

## 2018-11-20 ENCOUNTER — Ambulatory Visit: Payer: BLUE CROSS/BLUE SHIELD | Admitting: Podiatry

## 2018-11-20 ENCOUNTER — Encounter: Payer: Self-pay | Admitting: Podiatry

## 2018-11-20 VITALS — BP 119/82 | HR 72 | Resp 16 | Ht 75.0 in | Wt 245.0 lb

## 2018-11-20 DIAGNOSIS — B070 Plantar wart: Secondary | ICD-10-CM | POA: Diagnosis not present

## 2018-11-20 DIAGNOSIS — L989 Disorder of the skin and subcutaneous tissue, unspecified: Secondary | ICD-10-CM

## 2018-11-20 NOTE — Progress Notes (Signed)
   Subjective:    Patient ID: Chad Burke, male    DOB: Oct 31, 1981, 38 y.o.   MRN: [id number]  HPI    Review of Systems  All other systems reviewed and are negative.      Objective:   Physical Exam        Assessment & Plan:

## 2018-11-20 NOTE — Progress Notes (Signed)
  Subjective:  Patient ID: Chad Burke, male    DOB: 02-01-1981,  MRN: [id number]  Chief Complaint  Patient presents with  . Skin Problem    L sub met 4 lesion x 3 mo; 6/10 discomfort "feels like a pebble" -wose with shoes Tx: OTC corn pads -pt denies N/V/F/CH   38 y.o. male presents with the above complaint.   Review of Systems: Negative except as noted in the HPI. Denies N/V/F/Ch.  Past Medical History:  Diagnosis Date  . Anxiety   . Sleep difficulties     Current Outpatient Medications:  .  clonazePAM (KLONOPIN) 1 MG tablet, TAKE 1-3 TABLETS BY MOUTH EVERY NIGHT AT BEDTIME AS NEEDED FOR SLEEP, Disp: 90 tablet, Rfl: 0 .  desvenlafaxine (PRISTIQ) 100 MG 24 hr tablet, Take 100 mg by mouth daily., Disp: , Rfl:  .  zolpidem (AMBIEN CR) 6.25 MG CR tablet, TK 1 T PO QHS, Disp: , Rfl: 3  Social History   Tobacco Use  Smoking Status Former Smoker  . Packs/day: 1.00  . Years: 12.00  . Pack years: 12.00  . Types: Cigarettes  . Last attempt to quit: 11/08/2005  . Years since quitting: 13.0  Smokeless Tobacco Never Used    Allergies  Allergen Reactions  . Codeine     GI upset  . Prednisone Swelling   Objective:   Vitals:   11/20/18 1002  BP: 119/82  Pulse: 72  Resp: 16   Body mass index is 30.62 kg/m. Constitutional Well developed. Well nourished.  Vascular Dorsalis pedis pulses palpable bilaterally. Posterior tibial pulses palpable bilaterally. Capillary refill normal to all digits.  No cyanosis or clubbing noted. Pedal hair growth normal.  Neurologic Normal speech. Oriented to person, place, and time. Epicritic sensation to light touch grossly present bilaterally.  Dermatologic Nails intact Skin: hyperkeratosis with petechia proximal aspect of the 4th metatarsal with pain on lateral compression  Orthopedic: Normal joint ROM without pain or crepitus bilaterally. No visible deformities. No bony tenderness.   Radiographs: None Assessment:   1. Verruca  plantaris   2. Benign skin lesion    Plan:  Patient was evaluated and treated and all questions answered.  Verruca, left foot -Educated on the etiology. -Lesion destroyed as below. -Educated on post-op care.  Procedure: Destruction of Lesion Location: L proximal submet 4 Anesthesia: none Instrumentation: 312 blade. Technique: Debridement of lesion to petechial bleeding. Aperture pad applied around lesion. Small amount of canthrone applied to the base of the lesion. Dressing: Dry, sterile, compression dressing. Disposition: Patient tolerated procedure well. Advised to leave dressing on for 6-8 hours. Thereafter patient to wash the area with soap and water and applied band-aid. Off-loading pads dispensed. Patient to return in 3 weeks for follow-up.   Return in about 3 weeks (around 12/11/2018) for Verruca left foot.

## 2018-12-11 ENCOUNTER — Ambulatory Visit: Payer: BLUE CROSS/BLUE SHIELD | Admitting: Podiatry

## 2018-12-11 DIAGNOSIS — B070 Plantar wart: Secondary | ICD-10-CM

## 2018-12-11 NOTE — Progress Notes (Signed)
  Subjective:  Patient ID: Chad Burke, male    DOB: [birth year],  MRN: [id number]  Chief Complaint  Patient presents with  . verruca    F/U L verruca Pt. states," it's been more tender; 7/10 constant throbbing." Tx: none   38 y.o. male presents with the above complaint. Thinks its worse than previous. Didn't see the area blister up.  Review of Systems: Negative except as noted in the HPI. Denies N/V/F/Ch.  Past Medical History:  Diagnosis Date  . Anxiety   . Sleep difficulties     Current Outpatient Medications:  .  clonazePAM (KLONOPIN) 1 MG tablet, TAKE 1-3 TABLETS BY MOUTH EVERY NIGHT AT BEDTIME AS NEEDED FOR SLEEP, Disp: 90 tablet, Rfl: 0 .  desvenlafaxine (PRISTIQ) 100 MG 24 hr tablet, Take 100 mg by mouth daily., Disp: , Rfl:  .  zolpidem (AMBIEN CR) 6.25 MG CR tablet, TK 1 T PO QHS, Disp: , Rfl: 3  Social History   Tobacco Use  Smoking Status Former Smoker  . Packs/day: 1.00  . Years: 12.00  . Pack years: 12.00  . Types: Cigarettes  . Last attempt to quit: 11/08/2005  . Years since quitting: 13.0  Smokeless Tobacco Never Used    Allergies  Allergen Reactions  . Codeine     GI upset  . Prednisone Swelling   Objective:   There were no vitals filed for this visit. There is no height or weight on file to calculate BMI. Constitutional Well developed. Well nourished.  Vascular Dorsalis pedis pulses palpable bilaterally. Posterior tibial pulses palpable bilaterally. Capillary refill normal to all digits.  No cyanosis or clubbing noted. Pedal hair growth normal.  Neurologic Normal speech. Oriented to person, place, and time. Epicritic sensation to light touch grossly present bilaterally.  Dermatologic Nails intact Skin: hyperkeratosis with petechia proximal aspect of the 4th metatarsal with pain on lateral compression, lateral hallux   Orthopedic: Normal joint ROM without pain or crepitus bilaterally. No visible deformities. No bony tenderness.    Radiographs: None Assessment:   1. Verruca plantaris    Plan:  Patient was evaluated and treated and all questions answered.  Verruca, left foot -Educated on the etiology. -Lesion destroyed again as below. -Educated on post-op care.  Procedure: Destruction of Lesion Location: Left hallux lateral, left submet 4 prox Anesthesia: nonr Instrumentation: 312 blade. Technique: Debridement of lesion to petechial bleeding. Aperture pad applied around lesion. Small amount of canthrone applied to the base of the lesion. Dressing: Dry, sterile, compression dressing. Disposition: Patient tolerated procedure well. Advised to leave dressing on for 6-8 hours. Thereafter patient to wash the area with soap and water and applied band-aid. Off-loading pads dispensed. Patient to return in 2 weeks for follow-up.   No follow-ups on file.

## 2019-01-02 ENCOUNTER — Ambulatory Visit: Payer: BLUE CROSS/BLUE SHIELD | Admitting: Podiatry

## 2019-01-02 DIAGNOSIS — D492 Neoplasm of unspecified behavior of bone, soft tissue, and skin: Secondary | ICD-10-CM

## 2019-01-02 DIAGNOSIS — L989 Disorder of the skin and subcutaneous tissue, unspecified: Secondary | ICD-10-CM

## 2019-01-02 DIAGNOSIS — B070 Plantar wart: Secondary | ICD-10-CM

## 2019-01-02 NOTE — Progress Notes (Signed)
  Subjective:  Patient ID: Chad Burke, male    DOB: 13-Nov-1980,  MRN: [id number]  Chief Complaint  Patient presents with  . verruca    F/U L verruca Pt. states," improved, but still there. It doesn't hurt but when I put pressure I can tell it's there.": Tx: bandaid -pt deneis N/V/F?Ch/redness/ swelling   38 y.o. male presents with the above complaint. History above reviewed with patients.  Review of Systems: Negative except as noted in the HPI. Denies N/V/F/Ch.  Past Medical History:  Diagnosis Date  . Anxiety   . Sleep difficulties     Current Outpatient Medications:  .  clonazePAM (KLONOPIN) 1 MG tablet, TAKE 1-3 TABLETS BY MOUTH EVERY NIGHT AT BEDTIME AS NEEDED FOR SLEEP, Disp: 90 tablet, Rfl: 0 .  desvenlafaxine (PRISTIQ) 100 MG 24 hr tablet, Take 100 mg by mouth daily., Disp: , Rfl:  .  zolpidem (AMBIEN CR) 6.25 MG CR tablet, TK 1 T PO QHS, Disp: , Rfl: 3  Social History   Tobacco Use  Smoking Status Former Smoker  . Packs/day: 1.00  . Years: 12.00  . Pack years: 12.00  . Types: Cigarettes  . Last attempt to quit: 11/08/2005  . Years since quitting: 13.1  Smokeless Tobacco Never Used    Allergies  Allergen Reactions  . Codeine     GI upset  . Prednisone Swelling   Objective:   There were no vitals filed for this visit. There is no height or weight on file to calculate BMI. Constitutional Well developed. Well nourished.  Vascular Dorsalis pedis pulses palpable bilaterally. Posterior tibial pulses palpable bilaterally. Capillary refill normal to all digits.  No cyanosis or clubbing noted. Pedal hair growth normal.  Neurologic Normal speech. Oriented to person, place, and time. Epicritic sensation to light touch grossly present bilaterally.  Dermatologic Nails intact Skin: hyperkeratosis with petechia proximal aspect of the 4th metatarsal with pain on lateral compression, lateral hallux   Orthopedic: Normal joint ROM without pain or crepitus  bilaterally. No visible deformities. No bony tenderness.   Radiographs: None Assessment:   1. Verruca plantaris   2. Benign skin lesion    Plan:  Patient was evaluated and treated and all questions answered.  Verruca, left foot -Educated on the etiology. -Lesion destroyed again as below. -Educated on post-op care.  Procedure: Destruction of Lesion Location: Left hallux medially, left submet 4 Anesthesia: none Instrumentation: 15 blade. Technique: Debridement of lesion to petechial bleeding. Aperture pad applied around lesion. Small amount of canthrone applied to the base of the lesion. Dressing: Dry, sterile, compression dressing. Disposition: Patient tolerated procedure well. Advised to leave dressing on for 6-8 hours. Thereafter patient to wash the area with soap and water and applied band-aid. Off-loading pads dispensed. Patient to return in 2 weeks for follow-up.   Return in about 2 weeks (around 01/16/2019) for verruca f/u .

## 2019-01-16 ENCOUNTER — Ambulatory Visit: Payer: BLUE CROSS/BLUE SHIELD | Admitting: Podiatry

## 2019-01-16 DIAGNOSIS — B078 Other viral warts: Secondary | ICD-10-CM

## 2019-01-16 DIAGNOSIS — B079 Viral wart, unspecified: Secondary | ICD-10-CM

## 2019-01-16 MED ORDER — HYDROCODONE-ACETAMINOPHEN 5-325 MG PO TABS: 1.0000 | ORAL_TABLET | Freq: Four times a day (QID) | ORAL | 0 refills | Status: DC | PRN

## 2019-01-16 MED ORDER — CEPHALEXIN 500 MG PO CAPS: 500.0000 mg | ORAL_CAPSULE | Freq: Two times a day (BID) | ORAL | 0 refills | Status: DC

## 2019-01-16 NOTE — Patient Instructions (Signed)
Leave dressing intact until Friday. Do not get wet. Keep shoe on until Friday if able.  On Friday remove dressings. Leave small strips on incisions intact. It is ok at this time to shower and get your foot wet. No soaking in a tub yet.  After your shower, you can pat the area dry with a clean towel. Then apply band-aids over the incision areas and a clean sock.  You can wear your normal shoe if comfortable.  We will have you follow up Monday to check the incisions.  Take antibiotic as directed. Take pain medication only as needed.

## 2019-01-16 NOTE — Progress Notes (Signed)
  Subjective:  Patient ID: Chad Burke, male    DOB: [birth year],  MRN: [id number]  Chief Complaint  Patient presents with  . verruca    F/U L verruca Pt. states," feel slot better, but if I press on it it's tender; 5/10." tx: bandiad   38 y.o. male presents with the above complaint. Would like to discuss removal today, as he has a baby on the way and   Review of Systems: Negative except as noted in the HPI. Denies N/V/F/Ch.  Past Medical History:  Diagnosis Date  . Anxiety   . Sleep difficulties     Current Outpatient Medications:  .  clonazePAM (KLONOPIN) 1 MG tablet, TAKE 1-3 TABLETS BY MOUTH EVERY NIGHT AT BEDTIME AS NEEDED FOR SLEEP, Disp: 90 tablet, Rfl: 0 .  desvenlafaxine (PRISTIQ) 100 MG 24 hr tablet, Take 100 mg by mouth daily., Disp: , Rfl:  .  zolpidem (AMBIEN CR) 6.25 MG CR tablet, TK 1 T PO QHS, Disp: , Rfl: 3 .  cephALEXin (KEFLEX) 500 MG capsule, Take 1 capsule (500 mg total) by mouth 2 (two) times daily., Disp: 14 capsule, Rfl: 0 .  HYDROcodone-acetaminophen (NORCO) 5-325 MG tablet, Take 1 tablet by mouth every 6 (six) hours as needed for moderate pain., Disp: 12 tablet, Rfl: 0  Social History   Tobacco Use  Smoking Status Former Smoker  . Packs/day: 1.00  . Years: 12.00  . Pack years: 12.00  . Types: Cigarettes  . Last attempt to quit: 11/08/2005  . Years since quitting: 13.1  Smokeless Tobacco Never Used    Allergies  Allergen Reactions  . Codeine     GI upset  . Prednisone Swelling   Objective:   There were no vitals filed for this visit. There is no height or weight on file to calculate BMI. Constitutional Well developed. Well nourished.  Vascular Dorsalis pedis pulses palpable bilaterally. Posterior tibial pulses palpable bilaterally. Capillary refill normal to all digits.  No cyanosis or clubbing noted. Pedal hair growth normal.  Neurologic Normal speech. Oriented to person, place, and time. Epicritic sensation to light touch grossly  present bilaterally.  Dermatologic Nails intact Skin: hyperkeratosis with petechia proximal aspect of the 4th metatarsal with pain on lateral compression, lateral hallux   Orthopedic: Normal joint ROM without pain or crepitus bilaterally. No visible deformities. No bony tenderness.   Radiographs: None Assessment:   1. Verruca    Plan:  Patient was evaluated and treated and all questions answered.  Verruca, left foot -Lesions excised as below.  Procedure: Excision of benign lesions x2 Anesthesia: Lidocaine 1% with epi, 5 ccs per site Instrumentation: 30mm punch biopsy, 5 mm punch biopsy Technique: Following sterile skin prep, the biopsy punch was used to cut the full thickness area encapsulating the lesion. The lesion plus margin was excised from the underlying sub-q tissue. The incisions were then cleansed and closed with 4-0 monocryl. Dressing: Dry, sterile, compression dressing. Disposition: Patient tolerated procedure well. Patient to return in 1 week for follow-up.   Hallux Limitus Right -Discussed etiology -Would benefit from CMOs at later date   Return in about 1 week (around 01/23/2019) for post-op check - office surgery excision of lesions x2.

## 2019-01-22 ENCOUNTER — Encounter: Payer: BLUE CROSS/BLUE SHIELD | Admitting: Podiatry

## 2019-01-24 ENCOUNTER — Ambulatory Visit (INDEPENDENT_AMBULATORY_CARE_PROVIDER_SITE_OTHER): Payer: BLUE CROSS/BLUE SHIELD | Admitting: Sports Medicine

## 2019-01-24 ENCOUNTER — Other Ambulatory Visit: Payer: Self-pay

## 2019-01-24 ENCOUNTER — Encounter: Payer: Self-pay | Admitting: Sports Medicine

## 2019-01-24 VITALS — BP 128/89 | HR 67 | Resp 16

## 2019-01-24 DIAGNOSIS — L989 Disorder of the skin and subcutaneous tissue, unspecified: Secondary | ICD-10-CM

## 2019-01-24 DIAGNOSIS — B078 Other viral warts: Secondary | ICD-10-CM

## 2019-01-24 DIAGNOSIS — Z9889 Other specified postprocedural states: Secondary | ICD-10-CM

## 2019-01-24 DIAGNOSIS — B079 Viral wart, unspecified: Secondary | ICD-10-CM

## 2019-01-24 NOTE — Progress Notes (Signed)
Subjective: Chad Burke is a 38 y.o. male patient seen today in office for POV #1 date of procedure 01/16/2019 in office Dr. March Rummage status post excision of verruca bottom of left foot x2.  Patient admits that there is some soreness and pain when he applies pressure to the bottom of the left foot that is 4 out of 10 states that he finished off his antibiotics and has been taking Norco as needed for pain denies nausea vomiting fever chills or any other constitutional symptoms at this time.   Patient Active Problem List   Diagnosis Date Noted  . Chronic insomnia 06/22/2011    Current Outpatient Medications on File Prior to Visit  Medication Sig Dispense Refill  . cephALEXin (KEFLEX) 500 MG capsule Take 1 capsule (500 mg total) by mouth 2 (two) times daily. 14 capsule 0  . clonazePAM (KLONOPIN) 1 MG tablet TAKE 1-3 TABLETS BY MOUTH EVERY NIGHT AT BEDTIME AS NEEDED FOR SLEEP 90 tablet 0  . desvenlafaxine (PRISTIQ) 100 MG 24 hr tablet Take 100 mg by mouth daily.    Marland Kitchen HYDROcodone-acetaminophen (NORCO) 5-325 MG tablet Take 1 tablet by mouth every 6 (six) hours as needed for moderate pain. 12 tablet 0  . zolpidem (AMBIEN CR) 6.25 MG CR tablet TK 1 T PO QHS  3   No current facility-administered medications on file prior to visit.     Allergies  Allergen Reactions  . Codeine     GI upset  . Prednisone Swelling    Objective: There were no vitals filed for this visit.  General: No acute distress, AAOx3  Left foot: Sutures intact with no gapping or dehiscence at surgical sites, minimal swelling to left foot, no erythema, no warmth, no drainage, no signs of infection noted, Capillary fill time <3 seconds in all digits, gross sensation present via light touch to left foot.  No pain or crepitation with range of motion left foot.  No pain with calf compression.     Assessment and Plan:  Problem List Items Addressed This Visit    None    Visit Diagnoses    Verruca    -  Primary   Benign skin  lesion       Status post left foot surgery           -Patient seen and evaluated -Suture sites checked advised patient that he did have absorbable stitches placed however if the suture tails do not fall off the suture tails and may need to be clipped when it is time to check or remove any stitch material -Applied antibiotic cream and Band-Aid dressing to left procedure sites at ball of foot and first toe/webspace -Advised patient to continue daily with Band-Aid and antibiotic cream as needed -Advised patient to continue with good supportive shoes if he does experience increased pain to return to using his cam boot -Advised patient to limit activity to necessity  -Advised patient to ice and elevate as necessary and to continue with Norco as needed -Will plan for postop visit with Dr. March Rummage next week.  In the meantime, patient to call office if any issues or problems arise.   Landis Martins, DPM

## 2019-01-30 ENCOUNTER — Telehealth: Payer: BLUE CROSS/BLUE SHIELD | Admitting: Podiatry

## 2020-02-01 ENCOUNTER — Ambulatory Visit: Payer: PRIVATE HEALTH INSURANCE | Attending: Internal Medicine

## 2020-02-01 DIAGNOSIS — Z23 Encounter for immunization: Secondary | ICD-10-CM

## 2020-02-01 NOTE — Progress Notes (Signed)
   Covid-19 Vaccination Clinic  Name:  Chad Burke    MRN: [id number] DOB: [birth year]  02/01/2020  Chad Burke was observed post Covid-19 immunization for 15 minutes without incident. He was provided with Vaccine Information Sheet and instruction to access the V-Safe system.   Chad Burke was instructed to call 911 with any severe reactions post vaccine: Marland Kitchen Difficulty breathing  . Swelling of face and throat  . A fast heartbeat  . A bad rash all over body  . Dizziness and weakness   Immunizations Administered    Name Date Dose VIS Date Route   Pfizer COVID-19 Vaccine 02/01/2020  2:30 PM 0.3 mL 10/19/2019 Intramuscular   Manufacturer: [place]   Lot: [id number]   [place]: [id number]

## 2020-02-26 ENCOUNTER — Ambulatory Visit: Payer: PRIVATE HEALTH INSURANCE | Attending: Internal Medicine

## 2020-02-26 DIAGNOSIS — Z23 Encounter for immunization: Secondary | ICD-10-CM

## 2020-02-26 NOTE — Progress Notes (Signed)
   Covid-19 Vaccination Clinic  Name:  Chad Burke    MRN: [id number] DOB: 1981-07-05  02/26/2020  Mr. Both was observed post Covid-19 immunization for 15 minutes without incident. He was provided with Vaccine Information Sheet and instruction to access the V-Safe system.   Mr. [name] was instructed to call 911 with any severe reactions post vaccine: Marland Kitchen Difficulty breathing  . Swelling of face and throat  . A fast heartbeat  . A bad rash all over body  . Dizziness and weakness   Immunizations Administered    Name Date Dose VIS Date Route   Pfizer COVID-19 Vaccine 02/26/2020  2:30 PM 0.3 mL 01/02/2019 Intramuscular   Manufacturer: [place]   Lot: [id number]   [place]: [id number]

## 2021-08-03 ENCOUNTER — Ambulatory Visit (INDEPENDENT_AMBULATORY_CARE_PROVIDER_SITE_OTHER): Payer: 59 | Admitting: Psychiatry

## 2021-08-03 ENCOUNTER — Other Ambulatory Visit: Payer: Self-pay

## 2021-08-03 DIAGNOSIS — F4001 Agoraphobia with panic disorder: Secondary | ICD-10-CM | POA: Diagnosis not present

## 2021-08-03 DIAGNOSIS — Z87898 Personal history of other specified conditions: Secondary | ICD-10-CM | POA: Diagnosis not present

## 2021-08-03 DIAGNOSIS — F5104 Psychophysiologic insomnia: Secondary | ICD-10-CM | POA: Diagnosis not present

## 2021-08-03 DIAGNOSIS — R69 Illness, unspecified: Secondary | ICD-10-CM | POA: Diagnosis not present
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
## 2021-09-03 ENCOUNTER — Other Ambulatory Visit: Payer: Self-pay

## 2021-09-03 ENCOUNTER — Ambulatory Visit (INDEPENDENT_AMBULATORY_CARE_PROVIDER_SITE_OTHER): Payer: 59 | Admitting: Psychiatry

## 2021-09-03 DIAGNOSIS — R69 Illness, unspecified: Secondary | ICD-10-CM

## 2021-09-03 DIAGNOSIS — F4001 Agoraphobia with panic disorder: Secondary | ICD-10-CM | POA: Diagnosis not present

## 2021-09-03 DIAGNOSIS — Z62819 Personal history of unspecified abuse in childhood: Secondary | ICD-10-CM

## 2021-09-03 DIAGNOSIS — F5104 Psychophysiologic insomnia: Secondary | ICD-10-CM | POA: Diagnosis not present

## 2021-09-03 DIAGNOSIS — Z87898 Personal history of other specified conditions: Secondary | ICD-10-CM | POA: Diagnosis not present
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
## 2021-09-08 ENCOUNTER — Ambulatory Visit: Payer: 59 | Admitting: Psychiatry

## 2021-10-14 ENCOUNTER — Ambulatory Visit: Payer: 59 | Admitting: Psychiatry

## 2021-10-26 ENCOUNTER — Ambulatory Visit (INDEPENDENT_AMBULATORY_CARE_PROVIDER_SITE_OTHER): Payer: 59 | Admitting: Psychiatry

## 2021-10-26 ENCOUNTER — Other Ambulatory Visit: Payer: Self-pay

## 2021-10-26 DIAGNOSIS — F4001 Agoraphobia with panic disorder: Secondary | ICD-10-CM

## 2021-10-26 DIAGNOSIS — Z87898 Personal history of other specified conditions: Secondary | ICD-10-CM

## 2021-10-26 DIAGNOSIS — R69 Illness, unspecified: Secondary | ICD-10-CM | POA: Diagnosis not present
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
## 2021-11-11 ENCOUNTER — Other Ambulatory Visit: Payer: Self-pay

## 2021-11-11 ENCOUNTER — Ambulatory Visit (INDEPENDENT_AMBULATORY_CARE_PROVIDER_SITE_OTHER): Payer: 59 | Admitting: Psychiatry

## 2021-11-11 DIAGNOSIS — F4001 Agoraphobia with panic disorder: Secondary | ICD-10-CM

## 2021-11-11 DIAGNOSIS — Z87898 Personal history of other specified conditions: Secondary | ICD-10-CM | POA: Diagnosis not present

## 2021-11-11 DIAGNOSIS — R69 Illness, unspecified: Secondary | ICD-10-CM

## 2021-11-11 NOTE — Progress Notes (Unsigned)
Psychotherapy Progress Note [facility] Chad Moore, Chad Burke LP  Patient ID: [facility])    MRN: [id number] Therapy format: Individual psychotherapy Date: 11/11/2021      Start: 5:08p     Stop: ***:***     Time Spent: *** min Location: In-person   Session narrative (presenting needs, interim history, self-report of stressors and symptoms, applications of prior therapy, status changes, and interventions made in session) Was set to call Chad Burke after last session but has not made the call.  Routinely has buildup of reasons not to try things, avoid the sense of pressure   Therapeutic modalities: {AM:23362::"Cognitive Behavioral Therapy","Solution-Oriented/Positive Psychology"}  Mental Status/Observations:  Appearance:   {PSY:22683}     Behavior:  {PSY:21022743}  Motor:  {PSY:22302}  Speech/Language:   {PSY:22685}  Affect:  {PSY:22687}  Mood:  {PSY:31886}  Thought process:  {PSY:31888}  Thought content:    {PSY:567 321 4138}  Sensory/Perceptual disturbances:    {PSY:380-765-7146}  Orientation:  {Psych Orientation:23301::"Fully oriented"}  Attention:  {Good-Fair-Poor ratings:23770::"Good"}    Concentration:  {Good-Fair-Poor ratings:23770::"Good"}  Memory:  {PSY:806-571-0604}  Insight:    {Good-Fair-Poor ratings:23770::"Good"}  Judgment:   {Good-Fair-Poor ratings:23770::"Good"}  Impulse Control:  {Good-Fair-Poor ratings:23770::"Good"}   Risk Assessment: Danger to Self: {Risk:22599::"No"} Self-injurious Behavior: {Risk:22599::"No"} Danger to Others: {Risk:22599::"No"} Physical Aggression / Violence: {Risk:22599::"No"} Duty to Warn: {AMYesNo:22526::"No"} Access to Firearms a concern: {AMYesNo:22526::"No"}  Assessment of progress:  {Progress:22147::"progressing"}  Diagnosis:   ICD-10-CM   1. Panic disorder with agoraphobia  F40.01     2. r/o learning disorder, social anxiety, early onset dysthymia, and PTSD  R69      Plan:  Refer to  Vocational Rehab -- contact and see for help finding employers that will work with spotty record  Endorse involving with Chad Burke -- contact and see for same Look again at work that might be tolerable, even for short periods -- part time, temp, etc. -- to begin approaching the phobia.  Lower self-requirements as needed to take any step approaching. Dedicate to perspective of curiosity to find out, not obligation to make up for years of inadequacy, failure, disappointment, etc.  Work the practice of getting attention outside of worry thoughts and negatives to what is actually present, what is actually available, and how any work he does trade for any pay is successfully breaking out of the stuckness that bothers himself and his wife.   Orient Chad Burke, as able, to the philosophy of emphasizing curious over obligated, in order to break the phobia of approaching a work place/role.  It's not about "having" confidence or "making sure" it's something he can "stick with".  Just showing up comes first. Other recommendations/advice as may be noted above Continue to utilize previously learned skills ad lib Maintain medication as prescribed and work faithfully with relevant prescriber(s) if any changes are desired or seem indicated Call the clinic on-call service, 988/hotline, 911, or present to [facility] or ER if any life-threatening psychiatric crisis No follow-ups on file. Already scheduled visit in this office 11/23/2021.  Chad Burke, Chad Burke Chad Moore, Chad Burke LP Clinical Psychologist, [facility] [address] [place], [place] Burke [address] [phone]

## 2021-11-23 ENCOUNTER — Ambulatory Visit (INDEPENDENT_AMBULATORY_CARE_PROVIDER_SITE_OTHER): Payer: 59 | Admitting: Psychiatry

## 2021-11-23 ENCOUNTER — Other Ambulatory Visit: Payer: Self-pay

## 2021-11-23 DIAGNOSIS — Z87898 Personal history of other specified conditions: Secondary | ICD-10-CM

## 2021-11-23 DIAGNOSIS — F4001 Agoraphobia with panic disorder: Secondary | ICD-10-CM | POA: Diagnosis not present

## 2021-11-23 DIAGNOSIS — R69 Illness, unspecified: Secondary | ICD-10-CM | POA: Diagnosis not present

## 2021-11-23 NOTE — Progress Notes (Unsigned)
Psychotherapy Progress Note [facility] 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  Has not contacted [employer] directly yet, but is heartened by the idea that it's friendly and understanding.  Discussed possibility of driving jobs and gig work, e.g., Building control surveyor, or driving parts for [employer].    Will find himself catastro[phizing  Therapeutic modalities: {AM:23362::"Cognitive Behavioral Therapy","Solution-Oriented/Positive Psychology"}  Mental Status/Observations:  Appearance:   {PSY:22683}     Behavior:  {PSY:21022743}  Motor:  {PSY:22302}  Speech/Language:   {PSY:22685}  Affect:  {PSY:22687}  Mood:  {PSY:31886}  Thought process:  {PSY:31888}  Thought content:    {PSY:640-524-0375}  Sensory/Perceptual disturbances:    {PSY:917-415-7750}  Orientation:  {Psych Orientation:23301::"Fully oriented"}  Attention:  {Good-Fair-Poor ratings:23770::"Good"}    Concentration:  {Good-Fair-Poor ratings:23770::"Good"}  Memory:  {PSY:3615173225}  Insight:    {Good-Fair-Poor ratings:23770::"Good"}  Judgment:   {Good-Fair-Poor ratings:23770::"Good"}  Impulse Control:  {Good-Fair-Poor ratings:23770::"Good"}   Risk Assessment: Danger to Self: {Risk:22599::"No"} Self-injurious Behavior: {Risk:22599::"No"} Danger  to Others: {Risk:22599::"No"} Physical Aggression / Violence: {Risk:22599::"No"} Duty to Warn: {AMYesNo:22526::"No"} Access to Firearms a concern: {AMYesNo:22526::"No"}  Assessment of progress:  {Progress:22147::"progressing"}  Diagnosis: No diagnosis found. Plan:  *** Other recommendations/advice as may be noted above Continue to utilize previously learned skills ad lib Maintain medication as prescribed and work faithfully with relevant prescriber(s) if any changes are desired or seem indicated Call the clinic on-call service, 988/hotline, 911, or present to [facility] or ER if any life-threatening psychiatric crisis Return for session(s) already scheduled. Already scheduled visit in this office 12/07/2021.  [name], PhD Luan Moore, PhD LP Clinical Psychologist, [facility] [address] [place], [place] [address] [phone]

## 2021-12-07 ENCOUNTER — Ambulatory Visit (INDEPENDENT_AMBULATORY_CARE_PROVIDER_SITE_OTHER): Payer: 59 | Admitting: Psychiatry

## 2021-12-07 ENCOUNTER — Other Ambulatory Visit: Payer: Self-pay

## 2021-12-07 DIAGNOSIS — F4001 Agoraphobia with panic disorder: Secondary | ICD-10-CM

## 2021-12-07 DIAGNOSIS — R69 Illness, unspecified: Secondary | ICD-10-CM

## 2021-12-07 DIAGNOSIS — M25551 Pain in right hip: Secondary | ICD-10-CM | POA: Diagnosis not present

## 2021-12-07 NOTE — Progress Notes (Unsigned)
Psychotherapy Progress Note [facility] 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  Seeing him tomorrow, figures he should be clear about what his body can handle before he puts himself out for work.  Has made no calls for work prospects.  Signed up for SNAP last week.  Wife not eligible for unemployment, since she voluntarily left.  Prospect of her returning to work in April with [place] of "PTSD" from her now-released job (all about mind games and mistrust, seeing corruption).  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  Acknowledged important to know bodily limitations before committing to a job that would exacerbate conditions, but pressed again with the value of finding out, not creating more delays by presuming what can't or shouldn't happen.  Encouraged to go ahead and call Voc Rehab b/c will be ample time to get health findings worked into it before he actually meets with a Social worker.  Coached in making [employer] all, just to find out.  Reminded that even if he makes those calls and declares he is not interested, it will still be three victories in one just to call (overcome nerves to find out, practice call for later, learn more).  Challenged to   Concerned for his family [employer], a [employer] going through the ordeal of possible disaffiliation.   Small to begin with, maybe 30 attending.  Seeking another church.    Therapeutic modalities: {AM:23362::"Cognitive Behavioral Therapy","Solution-Oriented/Positive Psychology"}  Mental Status/Observations:  Appearance:   {PSY:22683}     Behavior:  {PSY:21022743}  Motor:  {PSY:22302}  Speech/Language:   {PSY:22685}  Affect:  {PSY:22687}  Mood:  {PSY:31886}  Thought process:  {PSY:31888}  Thought content:    {[phone]}  Sensory/Perceptual disturbances:    {[phone]}  Orientation:  {Psych Orientation:23301::"Fully oriented"}  Attention:  {Good-Fair-Poor ratings:23770::"Good"}    Concentration:  {Good-Fair-Poor ratings:23770::"Good"}  Memory:  {PSY:236-633-9074}  Insight:    {Good-Fair-Poor ratings:23770::"Good"}  Judgment:   {Good-Fair-Poor ratings:23770::"Good"}  Impulse Control:  {Good-Fair-Poor ratings:23770::"Good"}   Risk Assessment: Danger to Self: {Risk:22599::"No"} Self-injurious Behavior: {Risk:22599::"No"} Danger to Others: {Risk:22599::"No"} Physical Aggression / Violence: {Risk:22599::"No"} Duty to Warn: {AMYesNo:22526::"No"} Access to Firearms a concern: {AMYesNo:22526::"No"}  Assessment of progress:  {Progress:22147::"progressing"}  Diagnosis: No diagnosis found. Plan:  Recommend go ahead and call Voc Rehab, don't wait for medical info in line first Call [employer] to find out what they offer and how Check in to [place] or another gig delivery worker Worth catching up insurance CE credits for license, so he can  Other recommendations/advice as may be noted above Continue to utilize previously learned skills ad lib Maintain medication as prescribed and work faithfully with relevant prescriber(s) if any changes are desired or seem indicated Call the clinic on-call service, 988/hotline, 911, or present to [facility] or ER if any life-threatening psychiatric crisis Return for session(s) already scheduled. Already scheduled  visit in this office 12/21/2021.  [name], PhD Luan Moore, PhD LP Clinical Psychologist, [facility] Group [facility] [address], [place], [place] [address] [phone]

## 2021-12-08 ENCOUNTER — Ambulatory Visit (INDEPENDENT_AMBULATORY_CARE_PROVIDER_SITE_OTHER): Payer: 59

## 2021-12-08 ENCOUNTER — Ambulatory Visit (INDEPENDENT_AMBULATORY_CARE_PROVIDER_SITE_OTHER): Payer: 59 | Admitting: Orthopaedic Surgery

## 2021-12-08 ENCOUNTER — Encounter: Payer: Self-pay | Admitting: Orthopaedic Surgery

## 2021-12-08 DIAGNOSIS — M545 Low back pain, unspecified: Secondary | ICD-10-CM | POA: Diagnosis not present

## 2021-12-08 DIAGNOSIS — G8929 Other chronic pain: Secondary | ICD-10-CM

## 2021-12-08 DIAGNOSIS — M79674 Pain in right toe(s): Secondary | ICD-10-CM

## 2021-12-08 MED ORDER — DICLOFENAC SODIUM 75 MG PO TBEC: 75.0000 mg | DELAYED_RELEASE_TABLET | Freq: Two times a day (BID) | ORAL | 2 refills | Status: DC | PRN

## 2021-12-08 NOTE — Progress Notes (Signed)
Office Visit Note   Patient: Chad Burke           Date of Birth: 27-Nov-1980           MRN: [id number] Visit Date: 12/08/2021              Requested by: Lawerance Cruel, [place],  [place] [address] PCP: Lawerance Cruel, MD   Assessment & Plan: Visit Diagnoses:  1. Chronic right-sided low back pain without sciatica   2. Pain of right great toe     Plan: Impression is probable right hip labral tear and chronic right great toe pain.  In regards to the hip, would like to refer the patient to Dr. Ernestina Patches for fluoroscopic guided cortisone injection.  In regards to the toe, he is describing symptoms of hallux rigidus, but does not have evidence of this on x-ray.  Have recommended a stiff orthotic and NSAIDs for now.  If symptoms do not improve over the next several weeks he will follow-up with [place] for recheck.  Otherwise, follow-up as needed.  Follow-Up Instructions: Return if symptoms worsen or fail to improve.   Orders:  Orders Placed This Encounter  Procedures   XR Lumbar Spine 2-3 Views   XR Pelvis 1-2 Views   XR Toe Great Right   Meds ordered this encounter  Medications   diclofenac (VOLTAREN) 75 MG EC tablet    Sig: Take 1 tablet (75 mg total) by mouth 2 (two) times daily as needed.    Dispense:  60 tablet    Refill:  2      Procedures: No procedures performed   Clinical Data: No additional findings.   Subjective: Chief Complaint  Patient presents with   Lower Back - Pain   Right Great Toe - Pain    HPI patient is a pleasant 41 year old gentleman who comes in today with right hip and right great toe pain.  In regards to the hip, the pain is primarily to the groin and anterior thigh but does note occasional pain to the right lower back.  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  He also notes occasional catching.  He denies any paresthesias to right lower extremity.  No  previous injection to the hip.  No previous lumbar pathology.  No bowel or bladder change or saddle paresthesias.  Regards to his right great toe, has had pain to the base for years.  No known injury or change in activity.  He was seen by podiatrist at some point where he was diagnosed with early arthritis.  The pain is worse when he stepped awkwardly as well as when he dorsiflexes the toe.  He has stopped running as this seems to aggravate his symptoms as well.  Review of Systems as detailed in HPI.  All others reviewed and are negative.   Objective: Vital Signs: There were no vitals taken for this visit.  Physical Exam well-developed well-nourished gentleman in no acute distress.  Alert and oriented x3.  Ortho Exam right hip exam shows a positive logroll and positive FADIR.  No tenderness to the greater trochanter.  No tenderness to the lumbar spine or paraspinous musculature.  No pain with lumbar flexion or extension.  Negative straight leg raise.  Right toe exam shows no tenderness.  He does have slight increased pain with dorsiflexion to about 30 degrees.  He is  neurovascular intact distally.  Specialty Comments:  No specialty comments available.  Imaging: XR Toe Great Right  Result Date: 12/08/2021 No acute or structural abnormalities  XR Lumbar Spine 2-3 Views  Result Date: 12/08/2021 Degenerative changes L5-S1  XR Pelvis 1-2 Views  Result Date: 12/08/2021 No acute or structural abnormalities    PMFS History: Patient Active Problem List   Diagnosis Date Noted   Chronic insomnia 06/22/2011   Past Medical History:  Diagnosis Date   Anxiety    Sleep difficulties     Family History  Problem Relation Age of Onset   Diabetes Mother    Hypertension Mother    Anxiety disorder Mother    Hypertension Father    Hypothyroidism Sister     History reviewed. No pertinent surgical history. Social History   Occupational History   Occupation: PTAR  Tobacco Use   Smoking  status: Former    Packs/day: 1.00    Years: 12.00    Pack years: 12.00    Types: Cigarettes    Quit date: 11/08/2005    Years since quitting: 16.0   Smokeless tobacco: Never  Substance and Sexual Activity   Alcohol use: Yes    Comment: social   Drug use: No   Sexual activity: Not on file

## 2021-12-09 ENCOUNTER — Other Ambulatory Visit: Payer: Self-pay

## 2021-12-09 DIAGNOSIS — M25551 Pain in right hip: Secondary | ICD-10-CM

## 2021-12-21 ENCOUNTER — Other Ambulatory Visit: Payer: Self-pay

## 2021-12-21 ENCOUNTER — Ambulatory Visit (INDEPENDENT_AMBULATORY_CARE_PROVIDER_SITE_OTHER): Payer: 59 | Admitting: Psychiatry

## 2021-12-21 DIAGNOSIS — M545 Low back pain, unspecified: Secondary | ICD-10-CM

## 2021-12-21 DIAGNOSIS — Z87898 Personal history of other specified conditions: Secondary | ICD-10-CM | POA: Diagnosis not present

## 2021-12-21 DIAGNOSIS — F902 Attention-deficit hyperactivity disorder, combined type: Secondary | ICD-10-CM | POA: Diagnosis not present

## 2021-12-21 DIAGNOSIS — F4001 Agoraphobia with panic disorder: Secondary | ICD-10-CM

## 2021-12-21 DIAGNOSIS — G8929 Other chronic pain: Secondary | ICD-10-CM

## 2021-12-21 DIAGNOSIS — R69 Illness, unspecified: Secondary | ICD-10-CM

## 2021-12-21 NOTE — Progress Notes (Unsigned)
Psychotherapy Progress Note [facility] Luan Moore, PhD LP  Patient ID: Chad Burke Chad Burke)    MRN: [id number] Therapy format: Individual psychotherapy Date: 12/21/2021      Start: 2:19p     Stop: ***:***     Time Spent: *** min Location: {SvcLoc:22530::"In-person"}   Session narrative (presenting needs, interim history, self-report of stressors and symptoms, applications of prior therapy, status changes, and interventions made in session) Prozac now 60mg .  Finished his insurance CE credits, a little late, but finished.  Called [employer] on the way over today to start the process.  Approved for SNAP benefits, initially underfigured due to a clerical error.  Successfully swallowed pride to do that, figures benefit from doing so in calling [employer].  Knows he gets affected by   Physically, got orthopedic eval of hip, toe, and lower back.  Discovered degenerative changes L5-S1, on an oral antiinflammatory and will have hip injection.  Briefly showed stretches and recommended planks  Knows he is still cowed by learning difficulties, mostly marked by verbal issues -- recalling words,  Notes he started reading a Bible in 2014, set out to read cover to cover, just now in Revelation.   Recommended active reading techniques -- masking extraneous lines, bite-sized chunks of reading, read-and-write, and audiovisual forat (e.g., Bible app that can read aloud).    ADHD review -- as child, noted difficulty staying in seat with psychomotor restlessness/fidgeting, mind wandering, careless errors, dfxs sustaining attention, mind wandering, failure to follow through, dfx sequencing, procrastination (attrib to anxiety, not slipping), avoid tasks req concentration,  easily distracted by extraneous stimuli, some dfx keeping up with tasks.  Drug history favored stimulants -- cocaine, MDMA.  Currently off coffee, just stopped wanting coffee.  Daily tea, unsweetened, est 1-2 glasses at home, may binge on it  if eating out.  Stopped sweet tea when M dx'd diabetic, c. 2004.  Easily tempted by sweets.    Therapeutic modalities: {AM:23362::"Cognitive Behavioral Therapy","Solution-Oriented/Positive Psychology"}  Mental Status/Observations:  Appearance:   {PSY:22683}     Behavior:  {PSY:21022743}  Motor:  {PSY:22302}  Speech/Language:   {PSY:22685}  Affect:  {PSY:22687}  Mood:  {PSY:31886}  Thought process:  {PSY:31888}  Thought content:    {[phone] [phone]}  Sensory/Perceptual disturbances:    {PSY:269-812-5654}  Orientation:  {Psych Orientation:23301::"Fully oriented"}  Attention:  {Good-Fair-Poor ratings:23770::"Good"}    Concentration:  {Good-Fair-Poor ratings:23770::"Good"}  Memory:  {[phone]}  Insight:    {Good-Fair-Poor ratings:23770::"Good"}  Judgment:   {Good-Fair-Poor ratings:23770::"Good"}  Impulse Control:  {Good-Fair-Poor ratings:23770::"Good"}   Risk Assessment: Danger to Self: {Risk:22599::"No"} Self-injurious Behavior: {Risk:22599::"No"} Danger to Others: {Risk:22599::"No"} Physical Aggression / Violence: {Risk:22599::"No"} Duty to Warn: {AMYesNo:22526::"No"} Access to Firearms a concern: {AMYesNo:22526::"No"}  Assessment of progress:  {Progress:22147::"progressing"}  Diagnosis: No diagnosis found. Plan:  Ask father about booking some insurance sales/service time and figuring out "bits" [place] can represent, speak for, and practice just the two of them Other recommendations/advice as may be noted above Continue to utilize previously learned skills ad lib Maintain medication as prescribed and work faithfully with relevant prescriber(s) if any changes are desired or seem indicated Call the clinic on-call service, 988/hotline, 911, or present to [facility] or ER if any life-threatening psychiatric crisis No follow-ups on file. Already scheduled visit in this office 01/05/2022.  [name], PhD Luan Moore, PhD LP Clinical Psychologist, [facility]  Group [facility] [address], [place], [place] [address] [phone]

## 2021-12-23 ENCOUNTER — Encounter: Payer: Self-pay | Admitting: Physical Medicine and Rehabilitation

## 2021-12-23 ENCOUNTER — Other Ambulatory Visit: Payer: Self-pay

## 2021-12-23 ENCOUNTER — Ambulatory Visit: Payer: Self-pay

## 2021-12-23 ENCOUNTER — Ambulatory Visit (INDEPENDENT_AMBULATORY_CARE_PROVIDER_SITE_OTHER): Payer: 59 | Admitting: Physical Medicine and Rehabilitation

## 2021-12-23 DIAGNOSIS — M25551 Pain in right hip: Secondary | ICD-10-CM | POA: Diagnosis not present

## 2021-12-23 NOTE — Progress Notes (Signed)
° °  MARICUS TANZI - 41 y.o. male MRN [id number]  Date of birth: [birth year]  Office Visit Note: Visit Date: 12/23/2021 PCP: Lawerance Cruel, MD Referred by: Lawerance Cruel, MD  Subjective: Chief Complaint  Patient presents with   Right Hip - Pain   HPI:  Chad Burke is a 41 y.o. male who comes in today at the request of Dr. Eduard Roux for planned Right anesthetic hip arthrogram with fluoroscopic guidance.  The patient has failed conservative care including home exercise, medications, time and activity modification.  This injection will be diagnostic and hopefully therapeutic.  Please see requesting physician notes for further details and justification.   ROS Otherwise per HPI.  Assessment & Plan: Visit Diagnoses:    ICD-10-CM   1. Pain in right hip  M25.551 Large Joint Inj: R hip joint    XR C-ARM NO REPORT      Plan: No additional findings.   Meds & Orders: No orders of the defined types were placed in this encounter.   Orders Placed This Encounter  Procedures   Large Joint Inj: R hip joint   XR C-ARM NO REPORT    Follow-up: Return if symptoms worsen or fail to improve.   Procedures: Large Joint Inj: R hip joint on 12/23/2021 11:08 AM Indications: diagnostic evaluation and pain Details: 22 G 3.5 in needle, fluoroscopy-guided anterior approach  Arthrogram: No  Medications: 4 mL bupivacaine 0.25 %; 60 mg triamcinolone acetonide 40 MG/ML Outcome: tolerated well, no immediate complications  There was excellent flow of contrast producing a partial arthrogram of the hip. The patient did have relief of symptoms during the anesthetic phase of the injection. Procedure, treatment alternatives, risks and benefits explained, specific risks discussed. Consent was given by the patient. Immediately prior to procedure a time out was called to verify the correct patient, procedure, equipment, support staff and site/side marked as required. Patient was prepped and draped in the  usual sterile fashion.         Clinical History: No specialty comments available.     Objective:  VS:  HT:     WT:    BMI:      BP:    HR: bpm   TEMP: ( )   RESP:  Physical Exam   Imaging: No results found.

## 2021-12-23 NOTE — Progress Notes (Signed)
Pt state right hip pain. Pt state lifting and bending at work can cause a lot of pain in his hip. Pt state he take over the counter pain meds to help ease his pain.  Numeric Pain Rating Scale and Functional Assessment Average Pain 2   In the last MONTH (on 0-10 scale) has pain interfered with the following?  1. General activity like being  able to carry out your everyday physical activities such as walking, climbing stairs, carrying groceries, or moving a chair?  Rating(9)   -BT, -Dye Allergies. '

## 2022-01-03 MED ORDER — BUPIVACAINE HCL 0.25 % IJ SOLN
4.0000 mL | INTRAMUSCULAR | Status: AC | PRN
  Administered 2021-12-23: 4 mL via INTRA_ARTICULAR

## 2022-01-03 MED ORDER — TRIAMCINOLONE ACETONIDE 40 MG/ML IJ SUSP
60.0000 mg | INTRAMUSCULAR | Status: AC | PRN
  Administered 2021-12-23: 60 mg via INTRA_ARTICULAR

## 2022-01-05 ENCOUNTER — Other Ambulatory Visit: Payer: Self-pay

## 2022-01-05 ENCOUNTER — Ambulatory Visit (INDEPENDENT_AMBULATORY_CARE_PROVIDER_SITE_OTHER): Payer: 59 | Admitting: Psychiatry

## 2022-01-05 DIAGNOSIS — F4001 Agoraphobia with panic disorder: Secondary | ICD-10-CM | POA: Diagnosis not present

## 2022-01-05 DIAGNOSIS — F902 Attention-deficit hyperactivity disorder, combined type: Secondary | ICD-10-CM

## 2022-01-05 DIAGNOSIS — Z87898 Personal history of other specified conditions: Secondary | ICD-10-CM

## 2022-01-05 DIAGNOSIS — R69 Illness, unspecified: Secondary | ICD-10-CM | POA: Diagnosis not present

## 2022-01-05 NOTE — Progress Notes (Incomplete)
Psychotherapy Progress Note [facility] Luan Moore, PhD LP  Patient ID: Chad Burke [facility])    MRN: [id number] Therapy format: Individual psychotherapy Date: 01/05/2022      Start: ***:***     Stop: ***:***     Time Spent: *** min Location: {SvcLoc:22530::"In-person"}   Session narrative (presenting needs, interim history, self-report of stressors and symptoms, applications of prior therapy, status changes, and interventions made in session) Got approved for SNAP, "not hating it as much as I thought I would".  VR called back last week, asking if he wants services trough [place] or [place].  Apprehensive just thinking about meeting   Still apprehensive about his own memory and learning new things.  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  Wife has also noted, reading The Body Keeps the Score, how his visual imagination won't really work for him, with implication that he has perceptual blocking based on trauma.    Therapeutic modalities: {AM:23362::"Cognitive Behavioral Therapy","Solution-Oriented/Positive Psychology"}  Mental Status/Observations:  Appearance:   {PSY:22683}     Behavior:  {PSY:21022743}  Motor:  {PSY:22302}  Speech/Language:   {PSY:22685}  Affect:  {PSY:22687}  Mood:  {PSY:31886}  Thought process:  {PSY:31888}  Thought content:    {PSY:220-274-5165}  Sensory/Perceptual disturbances:    {[phone]}  Orientation:  {Psych Orientation:23301::"Fully oriented"}  Attention:  {Good-Fair-Poor ratings:23770::"Good"}    Concentration:  {Good-Fair-Poor ratings:23770::"Good"}  Memory:  {PSY:425-437-3354}  Insight:    {Good-Fair-Poor ratings:23770::"Good"}  Judgment:   {Good-Fair-Poor ratings:23770::"Good"}  Impulse Control:  {Good-Fair-Poor ratings:23770::"Good"}   Risk Assessment: Danger to Self:  {Risk:22599::"No"} Self-injurious Behavior: {Risk:22599::"No"} Danger to Others: {Risk:22599::"No"} Physical Aggression / Violence: {Risk:22599::"No"} Duty to Warn: {AMYesNo:22526::"No"} Access to Firearms a concern: {AMYesNo:22526::"No"}  Assessment of progress:  {Progress:22147::"progressing"}  Diagnosis: No diagnosis found. Plan:  *** Other recommendations/advice as may be noted above Continue to utilize previously learned skills ad lib Maintain medication as prescribed and work faithfully with relevant prescriber(s) if any changes are desired or seem indicated Call the clinic on-call service, 988/hotline, 911, or present to [facility] or ER if any life-threatening psychiatric crisis No follow-ups on file. Already scheduled visit in this office 01/19/2022.  [name], PhD Luan Moore, PhD LP Clinical Psychologist, [facility] [address], [place], [place] [address] [phone]

## 2022-01-18 NOTE — Progress Notes (Incomplete)
Psychotherapy Progress Note [facility] Luan Moore, PhD LP  Patient ID: Chad Burke [facility])    MRN: [id number] Therapy format: Individual psychotherapy Date: 11/11/2021      Start: 5:08p     Stop: ***:***     Time Spent: *** min Location: In-person   Session narrative (presenting needs, interim history, self-report of stressors and symptoms, applications of prior therapy, status changes, and interventions made in session) Was set to call [facility] aviation program after last session but has not made the call.  Routinely has buildup of reasons not to try things, avoid the sense of pressure.    Therapeutic modalities: {AM:23362::"Cognitive Behavioral Therapy","Solution-Oriented/Positive Psychology"}  Mental Status/Observations:  Appearance:   {PSY:22683}     Behavior:  {PSY:21022743}  Motor:  {PSY:22302}  Speech/Language:   {PSY:22685}  Affect:  {PSY:22687}  Mood:  {PSY:31886}  Thought process:  {PSY:31888}  Thought content:    {[phone]}  Sensory/Perceptual disturbances:    {[phone]}  Orientation:  {Psych Orientation:23301::"Fully oriented"}  Attention:  {Good-Fair-Poor ratings:23770::"Good"}    Concentration:  {Good-Fair-Poor ratings:23770::"Good"}  Memory:  {PSY:801-479-5911}  Insight:    {Good-Fair-Poor ratings:23770::"Good"}  Judgment:   {Good-Fair-Poor ratings:23770::"Good"}  Impulse Control:  {Good-Fair-Poor ratings:23770::"Good"}   Risk Assessment: Danger to Self: {Risk:22599::"No"} Self-injurious Behavior: {Risk:22599::"No"} Danger to Others: {Risk:22599::"No"} Physical Aggression / Violence: {Risk:22599::"No"} Duty to Warn: {AMYesNo:22526::"No"} Access to Firearms a concern: {AMYesNo:22526::"No"}  Assessment of progress:  {Progress:22147::"progressing"}  Diagnosis:   ICD-10-CM   1. Panic disorder with agoraphobia  F40.01     2. r/o learning disorder, social anxiety, early onset dysthymia, and PTSD  R69      Plan:  Refer to  Vocational Rehab -- contact and see for help finding employers that will work with spotty record  Endorse involving with [employer] -- contact and see for same Look again at work that might be tolerable, even for short periods -- part time, temp, etc. -- to begin approaching the phobia.  Lower self-requirements as needed to take any step approaching. Dedicate to perspective of curiosity to find out, not obligation to make up for years of inadequacy, failure, disappointment, etc.  Work the practice of getting attention outside of worry thoughts and negatives to what is actually present, what is actually available, and how any work he does trade for any pay is successfully breaking out of the stuckness that bothers himself and his wife.   Orient [name], as able, to the philosophy of emphasizing curious over obligated, in order to break the phobia of approaching a work place/role.  It's not about "having" confidence or "making sure" it's something he can "stick with".  Just showing up comes first. Other recommendations/advice as may be noted above Continue to utilize previously learned skills ad lib Maintain medication as prescribed and work faithfully with relevant prescriber(s) if any changes are desired or seem indicated Call the clinic on-call service, 988/hotline, 911, or present to [facility] or ER if any life-threatening psychiatric crisis No follow-ups on file. Already scheduled visit in this office 11/23/2021.  [name], PhD Luan Moore, PhD LP Clinical Psychologist, [facility] [address] [place], [place] [address] [phone]

## 2022-01-19 ENCOUNTER — Other Ambulatory Visit: Payer: Self-pay

## 2022-01-19 ENCOUNTER — Ambulatory Visit (INDEPENDENT_AMBULATORY_CARE_PROVIDER_SITE_OTHER): Payer: 59 | Admitting: Psychiatry

## 2022-01-19 DIAGNOSIS — F902 Attention-deficit hyperactivity disorder, combined type: Secondary | ICD-10-CM | POA: Diagnosis not present

## 2022-01-19 DIAGNOSIS — R69 Illness, unspecified: Secondary | ICD-10-CM

## 2022-01-19 DIAGNOSIS — F4001 Agoraphobia with panic disorder: Secondary | ICD-10-CM | POA: Diagnosis not present

## 2022-01-19 DIAGNOSIS — Z87898 Personal history of other specified conditions: Secondary | ICD-10-CM

## 2022-01-19 NOTE — Progress Notes (Signed)
Psychotherapy Progress Note ?[facility] ?Chad Moore, PhD LP ? ?Patient ID: Chad Burke [facility])    MRN: [id number] ?Therapy format: Individual psychotherapy ?Date: 01/19/2022      Start: 4:10p     Stop: 5:00p     Time Spent: 50 min ?Location: In-person  ? ?Session narrative (presenting needs, interim history, self-report of stressors and symptoms, applications of prior therapy, status changes, and interventions made in session) ?2 more weeks and still hasn't called back to Voc Rehab.  Freezes, backs off, still can't imagine being committed to a workplace and showing up every day for the rest of his working life.  Supportively confronted again thinking way too long and way too committed about the project, that his job right now is simply to gather more information and see if the help he is reaching out for can be help.  Renewed the extreme permission that he may call, find out, and flatly refuse to work with them if he so chooses, but it would still be a victory getting himself to make the contact.  Agreed he should try to make the call tomorrow and offered that he may call back to leave a message with staff to let me know he followed through.  Encouraged with the idea, per his faith, of the great cloud of witnesses cheering him on doing the hard but good thing. ? 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  Seeing that wealthy home and knowing he has 2 bedrooms and a toddler who needs her own bed soon largely has him feeling down, pressured, and more stuck.  Encouraged to block out the image and recommit to any of the several paths he has to improve the amount of time he is doing work for pay.  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  Confronted that he thinks about the  possibilities with an eye toward negating them and jumps to conclusions irrationally about what he would have to give up.  In order to have any of the relief or success he hopes, he needs to engage one of the options we have laid out in the last several sessions and get further along just finding out what is possible, and having the experience that it is okay to know more, so it will also be okay to do more. ? ?Therapeutic modalities: Cognitive Behavioral Therapy and Solution-Oriented/Positive Psychology ? ?Mental Status/Observations: ? ?Appearance:   Casual     ?Behavior:  Resistant  ?Motor:  Normal  ?Speech/Language:   Clear and Coherent  ?Affect:  Appropriate  ?Mood:  anxious and dysthymic  ?Thought process:  normal  ?Thought content:    WNL  ?Sensory/Perceptual disturbances:    WNL  ?Orientation:  Fully oriented  ?Attention:  Good  ?  ?Concentration:  Good  ?Memory:  grossly intact  ?Insight:    Fair  ?Judgment:   Good  ?Impulse Control:  Fair  ? ?Risk Assessment: ?Danger to Self: No Self-injurious Behavior: No ?Danger to Others: No Physical Aggression / Violence: No ?Duty to Warn: No Access to Firearms a concern: No ? ?Assessment of progress:  stabilized ? ?Diagnosis: ?  ICD-10-CM   ?1. Panic disorder with agoraphobia  F40.01   ? steadfastly resistant to steps that progress to exposure  ?  ?  2. Attention deficit hyperactivity disorder (ADHD), combined type  F90.2   ?  ?3. History of substance use  Z87.898   ?  ?4. r/o learning disorder, social anxiety, early onset dysthymia, and hx PTSD  R69   ?  ? ?Plan:  ?Job-seeking ?Try again calling back VR -- tomorrow, and report back ?Continuing option [employer] ?Continuing option driving and gig jobs or temp jobs ?Continuing option ask father for low-demand insurance tasks ?Orthopedics - recommend good stretching in addition to anti-inflammatory therapy.  Recommend also low back strengthening exercises such as planks, subject to medical validation. ?Learning  issues ?self-affirm reading comprehension and short-term memory issues seem real, and tied to likely ADHD ?probable caution regarding stimulant therapy, option to look into bupropion with psychiatry ?may be worth limited cognitive testing to validate memory issues, but cost control still indicated unless recommendations would be worthwhile ?Other recommendations/advice as may be noted above ?Continue to utilize previously learned skills ad lib ?Maintain medication as prescribed and work faithfully with relevant prescriber(s) if any changes are desired or seem indicated ?Call the clinic on-call service, 988/hotline, 911, or present to [facility] or ER if any life-threatening psychiatric crisis ?Return for session(s) already scheduled. ?Already scheduled visit in this office 02/01/2022. ? ?[name], PhD ?Chad Moore, PhD LP ?Clinical Psychologist, [facility] ?[facility] ?[address] ?[place], [place] [address] ?(o) [phone] ?

## 2022-02-01 ENCOUNTER — Ambulatory Visit: Payer: 59 | Admitting: Psychiatry

## 2022-02-16 ENCOUNTER — Ambulatory Visit (INDEPENDENT_AMBULATORY_CARE_PROVIDER_SITE_OTHER): Payer: 59 | Admitting: Psychiatry

## 2022-02-16 DIAGNOSIS — M25551 Pain in right hip: Secondary | ICD-10-CM

## 2022-02-16 DIAGNOSIS — G8929 Other chronic pain: Secondary | ICD-10-CM

## 2022-02-16 DIAGNOSIS — F4010 Social phobia, unspecified: Secondary | ICD-10-CM

## 2022-02-16 DIAGNOSIS — F4001 Agoraphobia with panic disorder: Secondary | ICD-10-CM | POA: Diagnosis not present

## 2022-02-16 DIAGNOSIS — Z8639 Personal history of other endocrine, nutritional and metabolic disease: Secondary | ICD-10-CM

## 2022-02-16 DIAGNOSIS — F902 Attention-deficit hyperactivity disorder, combined type: Secondary | ICD-10-CM

## 2022-02-16 DIAGNOSIS — F5104 Psychophysiologic insomnia: Secondary | ICD-10-CM

## 2022-02-16 DIAGNOSIS — Z87898 Personal history of other specified conditions: Secondary | ICD-10-CM

## 2022-02-16 DIAGNOSIS — F341 Dysthymic disorder: Secondary | ICD-10-CM

## 2022-02-16 DIAGNOSIS — M545 Low back pain, unspecified: Secondary | ICD-10-CM

## 2022-02-16 NOTE — Progress Notes (Addendum)
Psychotherapy Progress Note ?[facility] ?Luan Moore, PhD LP ? ?Patient ID: Chad Burke [facility])    MRN: [id number] ?Therapy format: Individual psychotherapy ?Date: 02/16/2022      Start: 2:08p     Stop: 2:57p     Time Spent: 49 min ?Location: In-person  ? ?Session narrative (presenting needs, interim history, self-report of stressors and symptoms, applications of prior therapy, status changes, and interventions made in session) ?Has successfully taken several steps to engage VR, will see them in person May 1.  Very nervous, but accepts orientation to how vocational counseling works.  Reviewed what to expect, the purposes involved, and encouraged further in braving his anxiety, both as practice for coping with his anxiety disorder and as his golden opportunity to get more specialized help reentering the employment market place.  Agrees ? ?Acknowledges hx of a vitamin D deficiency, no longer taking supplement.  Encouraged to reconsider, but respect his costs.  If in doubt, should retest and if indicated resume supplement.  Meanwhile, good amounts of sunshine should help if he remains susceptible.  Began a fish oil supplement.  Was taking two of them until he developed nosebleed.  Unusual symptoms, but respect his judgment, consult physician at discretion.  Tomorrow has med check.  Will bring up beta blocker with Ms. Poulos as a nonaddictive assist in anxiety management.  Briefed on the likely need to try a small dose of it at a time when he does not need it, in order to acclimate and gauge how much works and how quickly.  Better prepared than surprised, especially given his tendency to feel more fragile and worrisome about the effects of drugs. ? ?Successfully set up child bed for daughter.  It really helps to clarify time passing and the need to engage work in order to do best by his family.  Also feeling more acutely and poignantly how his daughter will grow up and go out on her own  someday.  Already feeling some separation anxiety for knowing it is coming.  Support provided for anticipatory grief and reassured that there will be many opportunities to be a loving and active father, though they will look different as she develops.  Assured he has time to enjoy her small. ? ?W has just gotten back to work with [employer] supply of hours so far, but it is some help to be back at work herself, both for her morale and for the family budget.  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  Work is re-aggravating his hip pain, will evaluate as able. ? ?Ivor [place] first trip back to church since pandemic began, felt good to get back, though this was poignant also.  Clear that it is experiencing dwindling attendance, maybe 40 compared to 200+ in his best memory.  Support/empathy provided, and encouraged further making whatever moves necessary to either renew or find a new church home, whichever serves. ? ?Clarified and framed salient points to make with vocational rehab about what his needs actually are and his issues, including relatively modest physical pain limitations and more important anxiety considerations and ADHD, including short-term memory difficulties, especially under anxious conditions.  Also noted that he tends to be a kinetic learner, considerable difficulty picking up through reading but does much better through doing and seeing.  Also worth acknowledging that he has a remote history of drug abuse which has affected self-esteem, social anxiety, and possibly concentration.   ? ?  Therapeutic modalities: Cognitive Behavioral Therapy and Solution-Oriented/Positive Psychology ? ?Mental Status/Observations: ? ?Appearance:   Casual     ?Behavior:  Appropriate  ?Motor:  Normal  ?Speech/Language:   Clear and Coherent  ?Affect:  Appropriate  ?Mood:  anxious  ?Thought process:  normal  ?Thought content:    WNL  ?Sensory/Perceptual disturbances:     WNL  ?Orientation:  Fully oriented  ?Attention:  Good  ?  ?Concentration:  Fair  ?Memory:  grossly intact  ?Insight:    Fair  ?Judgment:   Good  ?Impulse Control:  Fair  ? ?Risk Assessment: ?Danger to Self: No Self-injurious Behavior: No ?Danger to Others: No Physical Aggression / Violence: No ?Duty to Warn: No Access to Firearms a concern: No ? ?Assessment of progress:  progressing ? ?Diagnosis: ?  ICD-10-CM   ?1. Panic disorder with agoraphobia  F40.01   ?  ?2. Attention deficit hyperactivity disorder (ADHD), combined type  F90.2   ?  ?3. History of suspected LD, known substance use long sober  Z87.898   ?  ?4. Social anxiety disorder  F40.10   ?  ?5. Dysthymic disorder  F34.1   ?  ?6. Chronic insomnia  F51.04   ?  ?7. Chronic low back pain, unspecified back pain laterality, unspecified whether sciatica present  M54.50   ? G89.29   ?  ?8. Pain of right hip joint  M25.551   ?  ?9. History of vitamin D deficiency  Z86.39   ?  ? ?Plan:  ?Recommend adequate sunlight or check vitamin D level to ensure adequate vitamin D action and support of emotional coping and mental health ?Self-affirm moves made to brave evaluation anxiety and separation anxiety ?Practice self-soothing technique ad lib ?Endorse seeking beta blocker from psychiatry, prepare to try it out experimentally if prescribed ?Continue to exposed to job listings in order to practice approaching rather than avoiding anxiety about work.  Ensure that the spirit of looking at job listings is simply curious, simply fact finding, not overthinking what hardships or inadequacies he might face. ?Summarize ahead of the VR visit salient points of his needs and difficulties so that he is not relying on spontaneous memory when he gets there ?Other recommendations/advice as may be noted above ?Continue to utilize previously learned skills ad lib ?Maintain medication as prescribed and work faithfully with relevant prescriber(s) if any changes are desired or seem  indicated ?Call the clinic on-call service, 988/hotline, 911, or present to [facility] or ER if any life-threatening psychiatric crisis ?Return for session(s) already scheduled. ?Already scheduled visit in this office 03/02/2022. ? ?[name], PhD ?Luan Moore, PhD LP ?Clinical Psychologist, [facility] ?[facility] ?[address], [address] ?[place],  [address] ?(o) [phone] ?

## 2022-03-02 ENCOUNTER — Ambulatory Visit (INDEPENDENT_AMBULATORY_CARE_PROVIDER_SITE_OTHER): Payer: 59 | Admitting: Psychiatry

## 2022-03-02 DIAGNOSIS — F4010 Social phobia, unspecified: Secondary | ICD-10-CM

## 2022-03-02 DIAGNOSIS — F4001 Agoraphobia with panic disorder: Secondary | ICD-10-CM | POA: Diagnosis not present

## 2022-03-02 DIAGNOSIS — F341 Dysthymic disorder: Secondary | ICD-10-CM

## 2022-03-02 DIAGNOSIS — Z87898 Personal history of other specified conditions: Secondary | ICD-10-CM

## 2022-03-02 DIAGNOSIS — F902 Attention-deficit hyperactivity disorder, combined type: Secondary | ICD-10-CM

## 2022-03-02 DIAGNOSIS — F5104 Psychophysiologic insomnia: Secondary | ICD-10-CM

## 2022-03-02 NOTE — Progress Notes (Signed)
Psychotherapy Progress Note ?[facility] ?Luan Moore, PhD LP ? ?Patient ID: Chad Burke [facility])    MRN: [id number] ?Therapy format: Individual psychotherapy ?Date: 03/02/2022      Start: 2:20p     Stop: 3:05p     Time Spent: 45 min ?Location: In-person  ? ?Session narrative (presenting needs, interim history, self-report of stressors and symptoms, applications of prior therapy, status changes, and interventions made in session) ?Embarrassed to say he forgot what he was going to write down from last session's summary for VR as soon as he got to the elevator.  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  Advocated that he introduce the strong anxiety he feels making inquiries or applications, be ready to note difficulties getting in with psychiatry (to obtain non-addictive anxiety medication).  Framed the vulnerable encounter with a stranger and the occasion to feel scrutinized as still his golden opportunity to get help connecting with a suitable job and suitable work environment in which he can rebuild his track record of showing up and rebuild his confidence that he can manage anxiety in the process.  Encouraged in all steps he has taken so far, and how risks taken have paid off. ? ?Notes he has a gym situation now.  Fees going up sharply, wants to save money, but has an 11-year attachment to [employer]  Knows of another gym convenient to him, much more reasonably priced but smaller.  Some anxiety about whether it will feel crowded.  Advocated that he let himself go take a look and see for himself, talk with someone about conditions and contract terms, and if need be go home and think about it, but give himself the  opportunity to show up, learn more, and acclimate. ? ?Otherwise, worry for how he'll be when HJarrett Sohogoes to preschool this fall.  Fears he will be so overcome that he will be embarrassingly tearful and heartbroken.  Affirmed and encouraged.  Will be taking her to a dance class for 6 weeks, where her elder cousin will assist, and he can standby out of sight or take time away, leaving her in others' hands.  Legitimately is looking to get her socialization going this way, does not want her to be so sheltered she cannot relate well to others.  Assured him that if she is the child she sounds like, she will blossom and enjoy herself and want to share it all with him, too, and that it will be good for both of them to grow in being able to part company and return. ? ?Therapeutic modalities: Cognitive Behavioral Therapy and Solution-Oriented/Positive Psychology ? ?Mental Status/Observations: ? ?Appearance:   Casual     ?Behavior:  Appropriate  ?Motor:  Normal  ?Speech/Language:   Clear and Coherent  ?Affect:  Appropriate  ?Mood:  anxious  ?Thought process:  normal  ?Thought content:    WNL  ?Sensory/Perceptual disturbances:    WNL  ?Orientation:  Fully oriented  ?Attention:  Good  ?  ?Concentration:  Fair  ?Memory:  Some limitation STM  ?Insight:    Good  ?Judgment:   Good  ?Impulse Control:  Good  ? ?Risk Assessment: ?Danger to Self: No Self-injurious Behavior:  No ?Danger to Others: No Physical Aggression / Violence: No ?Duty to Warn: No Access to Firearms a concern: No ? ?Assessment of progress:  progressing ? ?Diagnosis: ?  ICD-10-CM   ?1. Panic disorder with agoraphobia  F40.01   ?  ?2. Attention deficit hyperactivity disorder (ADHD), combined type  F90.2   ?  ?3. History of suspected LD, known substance use long sober  Z87.898   ?  ?4. Social anxiety disorder  F40.10   ?  ?5. Dysthymic disorder  F34.1   ?  ?6. Chronic insomnia  F51.04   ?  ? ?Plan:  ?Take talking points for VR.  If interested, ask VR if OK to record the  meeting for his memory's sake. ?Tend vitamin D as indicated -- sunlight, testing, supplement ?Self-affirm moves made to brave evaluation anxiety and separation anxiety ?Practice self-soothing technique ad lib ?Endorse seeking beta blocker from psychiatry, prepare to try it out experimentally if prescribed ?Continue to expose to job listings in order to practice approaching rather than avoiding anxiety about work.  Ensure that the spirit of looking at job listings is simply curious, simply fact finding, not overthinking what hardships or inadequacies he might face. ?Other recommendations/advice as may be noted above ?Continue to utilize previously learned skills ad lib ?Maintain medication as prescribed and work faithfully with relevant prescriber(s) if any changes are desired or seem indicated ?Call the clinic on-call service, 988/hotline, 911, or present to [facility] or ER if any life-threatening psychiatric crisis ?Return for session(s) already scheduled. ?Already scheduled visit in this office 03/15/2022. ? ?[name], PhD ?Luan Moore, PhD LP ?Clinical Psychologist, [facility] ?[facility] ?[address], [address] ?[place], [place] [address] ?(o) [phone] ?

## 2022-03-15 ENCOUNTER — Ambulatory Visit (INDEPENDENT_AMBULATORY_CARE_PROVIDER_SITE_OTHER): Payer: 59 | Admitting: Psychiatry

## 2022-03-15 DIAGNOSIS — F4010 Social phobia, unspecified: Secondary | ICD-10-CM

## 2022-03-15 DIAGNOSIS — F4001 Agoraphobia with panic disorder: Secondary | ICD-10-CM

## 2022-03-15 DIAGNOSIS — F341 Dysthymic disorder: Secondary | ICD-10-CM

## 2022-03-15 DIAGNOSIS — Z87898 Personal history of other specified conditions: Secondary | ICD-10-CM | POA: Diagnosis not present

## 2022-03-15 DIAGNOSIS — M545 Low back pain, unspecified: Secondary | ICD-10-CM

## 2022-03-15 DIAGNOSIS — F902 Attention-deficit hyperactivity disorder, combined type: Secondary | ICD-10-CM | POA: Diagnosis not present

## 2022-03-15 DIAGNOSIS — F5104 Psychophysiologic insomnia: Secondary | ICD-10-CM

## 2022-03-15 DIAGNOSIS — M25551 Pain in right hip: Secondary | ICD-10-CM

## 2022-03-15 DIAGNOSIS — G8929 Other chronic pain: Secondary | ICD-10-CM

## 2022-03-15 NOTE — Progress Notes (Signed)
Psychotherapy Progress Note ?[facility] ?Luan Moore, PhD LP ? ?Patient ID: Chad Burke [facility])    MRN: [id number] ?Therapy format: Individual psychotherapy ?Date: 03/15/2022      Start: 2:08p     Stop: 2:58p     Time Spent: 50 min ?Location: In-person  ? ?Session narrative (presenting needs, interim history, self-report of stressors and symptoms, applications of prior therapy, status changes, and interventions made in session) ?Since last seen, ROI received from [facility] and inquiry made about conferring rather than sending records, per se, due to sensitive content.  Says for himself that VR counselor offered they would do psych eval of some sort if indicated.  Confirms his first meeting was anxiety-provoking in advance, had panic attack the night before, but counselor very kind, and he's glad for the referral and the push.   ? ?Reviewed use of breathing to calm -- basically taking deep breaths -- and trained further in diaphragmatic breathing and paced exhalations, to good relief.  Referred back to panic control workbook chapter for more on reasoning with himself and paced breathing technique. ? ?Reveals some experience with RLS, the classic squirmy-worm feeling in his legs.  Biometrics have told him he tends not to get enough deep sleep, which can contribute.  Definitely feels better and thinks more clearly after getting a better night for deep sleep -- notices it in attention, concentration, memory, and anxiety tolerance.  Typically sensitive to light and sound, uses an eye mask and earplugs routinely, and takes melatonin just before bed, for years now.  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  Oriented to the blue light issue and tried orange glasses in session, with instantaneous relief.   ? ?Notes aphantasia, a primary difficulty with sensory imagining.  Discussed in brief, and endorsed it as another aspect of being  somewhat neuroatypical, and a reasonable roadblock to imagining other ways of coping, imagining futures that he wants, and possibly taking perspective.  Effectively, he is more bound in his senses to the now, whereas others may be better able to suspend judgment and respond to "what if's".  If true, it puts more of a premium on just getting positive experience so that he has a better base of memory to work from for seeing difficult things work out. ? ?Re. work, has been fairly busy lately with father but is hopefully of getting placed through VR.  He is aware that there will be tedious aspects, for example, looking through job listings.  He is optimistic about career and occupational assessment and about what ever degree of hand holding there may be in looking at job listings.  Acknowledges a history of looking at jobs, and even noticing businesses while out driving, and more or less automatically considering how stressful and anxiety provoking the may be and talking himself into failure before even encountering the actual conditions of the job.  For that reason, his psychiatrist had advised him against even looking at jobs at one point clarified in session that the advice is temporary, until he has better mastery over his mindset and thinking while looking at jobs.  Doubtless we will focus at some point on coaching the mindset and self talk during job search in order to better inoculate him against panicky thinking that has thus far interfered with significant efforts to approach work. ? ?Therapeutic modalities: Cognitive Behavioral Therapy and Solution-Oriented/Positive Psychology ? ?Mental Status/Observations: ? ?Appearance:  Casual     ?Behavior:  Appropriate  ?Motor:  Normal  ?Speech/Language:   Clear and Coherent  ?Affect:  Appropriate  ?Mood:  anxious and less  ?Thought process:  normal  ?Thought content:    WNL  ?Sensory/Perceptual disturbances:    WNL  ?Orientation:  Fully oriented  ?Attention:  Good  ?   ?Concentration:  Good  ?Memory:  grossly intact  ?Insight:    Good  ?Judgment:   Good  ?Impulse Control:  Good  ? ?Risk Assessment: ?Danger to Self: No Self-injurious Behavior: No ?Danger to Others: No Physical Aggression / Violence: No ?Duty to Warn: No Access to Firearms a concern: No ? ?Assessment of progress:  progressing ? ?Diagnosis: ?  ICD-10-CM   ?1. Panic disorder with agoraphobia  F40.01   ?  ?2. Social anxiety disorder  F40.10   ?  ?3. Attention deficit hyperactivity disorder (ADHD), combined type  F90.2   ?  ?4. History of suspected LD, known substance use long sober  Z87.898   ?  ?5. Dysthymic disorder  F34.1   ?  ?6. Chronic insomnia  F51.04   ?  ?7. Chronic low back pain, unspecified back pain laterality, unspecified whether sciatica present  M54.50   ? G89.29   ?  ?8. Pain of right hip joint  M25.551   ?  ? ?Plan:  ?For sleep readiness, backup melatonin timing to about 2 hours before bedtime, as tolerated.  Encouraged to obtain and use amber glasses to help with natural and timely melatonin.  Continue other measures as needed and consult on sleep hygiene as needed. ?Practice diaphragmatic breathing and paced breathing techniques for anxiety control, for eventual use as a "fire extinguisher" for kindling panic ?Follow through with VR as intended ?Future emphasis on distress tolerance while looking at job listings ?Generally, for all social and performance anxiety exposures, continue to endorse an attitude of going in anyway and capitalizing on opportunities to break the cycle of obsessing and catastrophizing ahead of time ?Other recommendations/advice as may be noted above ?Continue to utilize previously learned skills ad lib ?Maintain medication as prescribed and work faithfully with relevant prescriber(s) if any changes are desired or seem indicated ?Call the clinic on-call service, 988/hotline, 911, or present to [facility] or ER if any life-threatening psychiatric crisis ?Return for session(s) already  scheduled. ?Already scheduled visit in this office 03/29/2022. ? ?[name], PhD ?Luan Moore, PhD LP ?Clinical Psychologist, [facility] ?[facility] ?[address] ?[place], [place] [address] ?(o) [phone] ?

## 2022-03-29 ENCOUNTER — Ambulatory Visit: Payer: 59 | Admitting: Psychiatry

## 2022-04-14 ENCOUNTER — Ambulatory Visit (INDEPENDENT_AMBULATORY_CARE_PROVIDER_SITE_OTHER): Payer: 59 | Admitting: Psychiatry

## 2022-04-14 DIAGNOSIS — F4001 Agoraphobia with panic disorder: Secondary | ICD-10-CM | POA: Diagnosis not present

## 2022-04-14 DIAGNOSIS — F4010 Social phobia, unspecified: Secondary | ICD-10-CM

## 2022-04-14 DIAGNOSIS — F341 Dysthymic disorder: Secondary | ICD-10-CM

## 2022-04-14 DIAGNOSIS — F902 Attention-deficit hyperactivity disorder, combined type: Secondary | ICD-10-CM | POA: Diagnosis not present

## 2022-04-14 DIAGNOSIS — Z87898 Personal history of other specified conditions: Secondary | ICD-10-CM

## 2022-04-14 DIAGNOSIS — Z8639 Personal history of other endocrine, nutritional and metabolic disease: Secondary | ICD-10-CM

## 2022-04-14 NOTE — Progress Notes (Signed)
Psychotherapy Progress Note [facility] Luan Moore, PhD LP  Patient ID: Chad Burke [facility])    MRN: [id number] Therapy format: Individual psychotherapy Date: 04/14/2022      Start: 2:14p     Stop: 3:02p     Time Spent: 48 min Location: In-person   Session narrative (presenting needs, interim history, self-report of stressors and symptoms, applications of prior therapy, status changes, and interventions made in session) No word from [place] as yet, trust the counselor has the records she needs.  Socially, got out with wife and friends first time since before pandemic.  Felt notably different, relieved when got home.  Physical recently, blood work showing a jump in cholesterol.  Discussed measures for health, likely help from carb control.  Money is tighter again.  [place] work was not as busy as hoped, then ended.  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  Applauded asserting himself for work that he had not that long ago considered too intimidating and encouraged in [place] it.  Discussed ways of breaking down tasks to make for doable doses of courage and exposure.  Therapeutic modalities: Cognitive Behavioral Therapy and Solution-Oriented/Positive Psychology  Mental Status/Observations:  Appearance:   Casual     Behavior:  Appropriate  Motor:  Normal  Speech/Language:   Clear and Coherent  Affect:  Appropriate  Mood:  anxious  Thought process:  normal  Thought content:    worry  Sensory/Perceptual disturbances:    WNL  Orientation:  Fully oriented  Attention:  Good    Concentration:  Good  Memory:  WNL  Insight:    Good  Judgment:   Good  Impulse Control:  Good   Risk Assessment: Danger to Self: No Self-injurious Behavior: No Danger to Others: No Physical Aggression / Violence: No Duty to Warn: No Access to Firearms a concern:  No  Assessment of progress:  progressing  Diagnosis:   ICD-10-CM   1. Panic disorder with agoraphobia  F40.01     2. Social anxiety disorder  F40.10     3. Attention deficit hyperactivity disorder (ADHD), combined type  F90.2     4. History of suspected LD, known substance use long sober  Z87.898     5. Dysthymic disorder  F34.1     6. History of vitamin D deficiency  Z86.39      Plan:  Continue working with father and plan manageable forays into insurance work with his support Continue generally, for all social and performance anxiety exposures, continue to endorse an attitude of going in anyway and capitalizing on opportunities to break the cycle of obsessing and catastrophizing ahead of time Practice diaphragmatic breathing and paced breathing techniques for anxiety control, for eventual use as a "fire extinguisher" for kindling panic For sleep -- melatonin timing about 2 hours before bedtime, as tolerated; may use amber glasses to help set internal clock; continue other measures as needed and consult on sleep hygiene as needed. Follow through with VR as intended Future emphasis on distress tolerance while looking at job listings Continue to utilize previously learned skills ad lib Maintain medication as prescribed and work faithfully with relevant prescriber(s) if any changes are desired or seem indicated Call the clinic on-call service, 988/hotline, 911, or present to [facility] or ER if any life-threatening psychiatric crisis Return for session(s) already scheduled. Already scheduled visit in this office  04/28/2022.  [name], PhD Luan Moore, PhD LP Clinical Psychologist, [facility] [address], [place], [place] [address] [phone]

## 2022-04-16 ENCOUNTER — Other Ambulatory Visit: Payer: Self-pay | Admitting: Family Medicine

## 2022-04-16 DIAGNOSIS — Z0000 Encounter for general adult medical examination without abnormal findings: Secondary | ICD-10-CM

## 2022-04-28 ENCOUNTER — Ambulatory Visit (INDEPENDENT_AMBULATORY_CARE_PROVIDER_SITE_OTHER): Payer: 59 | Admitting: Psychiatry

## 2022-04-28 DIAGNOSIS — F4010 Social phobia, unspecified: Secondary | ICD-10-CM | POA: Diagnosis not present

## 2022-04-28 DIAGNOSIS — F4001 Agoraphobia with panic disorder: Secondary | ICD-10-CM

## 2022-04-28 DIAGNOSIS — F902 Attention-deficit hyperactivity disorder, combined type: Secondary | ICD-10-CM

## 2022-04-28 DIAGNOSIS — F341 Dysthymic disorder: Secondary | ICD-10-CM

## 2022-04-28 DIAGNOSIS — Z87898 Personal history of other specified conditions: Secondary | ICD-10-CM

## 2022-04-28 NOTE — Progress Notes (Signed)
Psychotherapy Progress Note [facility] 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  Premedicating with Klonopin helped, and knowing it's a familiar client, and informal setting.  Money has been extra tight of late, so opportune to get some work in today.  Generously paid by father for the work.  Sill can worry about being more on the spot for customer questions and looking ignorant.  Affirmed how he can help customers understand the product and get to the point of an informed decision whether to buy coverage.  Hard to keep from racing on to "what if" scenarios and more advanced tasks that he would find too intimidating.  Confronted as unfair to his confidence-building to let those things be in focus, recommit to repeating lower-intensity tasks and building in the next challenges one at a time.  Is particularly bothered by the possibility of looking anxious, or not knowing an answer and being dismissed by customers.  Discussed/encouraged in studying up on contract facts, roleplaying Q&A with dad.  Also discussed possible opportunities for expanded work within insurance role, depending on availability, as father is only Proofreader.  Discussed further ADHD and anxiety interplay, and how it may be worth trying out a situational stimulant prescription, on theory that anxiety will reduce as he sees himself more cognitively competent to learn material and represent it.  Urged against trying to take stimulant and sedative at the same time, and agreed stim use would  be situational.  Directed to psychiatry, whom he'll see in 6 days.    Physical recently, as noted last time, and looking back sees he has had elevated cholesterol for some time.  Will be having calcium scoring CT coming, and may be recommended for OTC cholesterol management.  Dicussed likelihood it is based on omega 3, which would be good for multiple conditions and probably helpful with emotional and cognitive concerns.    Therapeutic modalities: Cognitive Behavioral Therapy and Solution-Oriented/Positive Psychology  Mental Status/Observations:  Appearance:   Casual     Behavior:  Appropriate  Motor:  Normal  Speech/Language:   Clear and Coherent  Affect:  Appropriate  Mood:  anxious and responsive  Thought process:  normal  Thought content:    WNL  Sensory/Perceptual disturbances:    WNL  Orientation:  Fully oriented  Attention:  Good    Concentration:  Good  Memory:  WNL  Insight:    Good  Judgment:   Good  Impulse Control:  Good   Risk Assessment: Danger to Self: No Self-injurious Behavior: No Danger to Others: No Physical Aggression / Violence: No Duty to Warn: No Access to Firearms a concern: No  Assessment of progress:  progressing  Diagnosis:   ICD-10-CM   1. Social anxiety disorder  F40.10     2. Attention deficit hyperactivity disorder (ADHD), combined type  F90.2     3. Panic disorder with agoraphobia  F40.01     4. Dysthymic disorder  F34.1     5. History of suspected LD, known substance use long sober  Z87.898  Plan:  Solicit more opportunities to make insurance calls, do scheduling calls for them, and to role play handling customer questions with father.  If supply of insurance work is too limited, option to see about loaning out to other [employer] reps if they could use a deputy/partner to work accounts. Put down worry over doing more anxiety-challenging aspects of insurance work, just build on what's comfortable enough, repeat till no need of Klonopin  and expand tasks Check with psychiatry about trying out stimulant for performance situations; ADHD seems valid Recommend Super B complex for nutritional balance, stress effects, and alertness Most likely recommend omega 3 for suspected cholesterol issue and general antiinflammation Check on PCP's idea for OTC cholesterol control -- if omega 3, go ahead on grounds that it will have cognitive and antiinflammatory benefits Continue working with father and plan manageable forays into insurance work with his support Continue generally, for all social and performance anxiety exposures, continue to endorse an attitude of going in anyway and capitalizing on opportunities to break the cycle of obsessing and catastrophizing ahead of time Practice diaphragmatic breathing and paced breathing techniques for anxiety control, for eventual use as a "fire extinguisher" for kindling panic For sleep -- melatonin timing about 2 hours before bedtime, as tolerated; may use amber glasses to help set internal clock; continue other measures as needed and consult on sleep hygiene as needed. Follow through with VR as intended Future emphasis on distress tolerance while looking at job listings Other recommendations/advice as may be noted above Continue to utilize previously learned skills ad lib Maintain medication as prescribed and work faithfully with relevant prescriber(s) if any changes are desired or seem indicated Call the clinic on-call service, 988/hotline, 911, or present to [facility] or ER if any life-threatening psychiatric crisis Return for session(s) already scheduled. Already scheduled visit in this office 05/13/2022.  [name], PhD Luan Moore, PhD LP Clinical Psychologist, [facility] [address], [place], [place] [address] [phone]

## 2022-05-13 ENCOUNTER — Ambulatory Visit (INDEPENDENT_AMBULATORY_CARE_PROVIDER_SITE_OTHER): Payer: 59 | Admitting: Psychiatry

## 2022-05-13 DIAGNOSIS — F341 Dysthymic disorder: Secondary | ICD-10-CM

## 2022-05-13 DIAGNOSIS — F4010 Social phobia, unspecified: Secondary | ICD-10-CM | POA: Diagnosis not present

## 2022-05-13 DIAGNOSIS — F902 Attention-deficit hyperactivity disorder, combined type: Secondary | ICD-10-CM

## 2022-05-13 DIAGNOSIS — F4001 Agoraphobia with panic disorder: Secondary | ICD-10-CM | POA: Diagnosis not present

## 2022-05-13 DIAGNOSIS — Z8639 Personal history of other endocrine, nutritional and metabolic disease: Secondary | ICD-10-CM

## 2022-05-13 DIAGNOSIS — Z87898 Personal history of other specified conditions: Secondary | ICD-10-CM
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
## 2022-05-27 ENCOUNTER — Ambulatory Visit: Payer: 59 | Admitting: Psychiatry

## 2022-05-31 ENCOUNTER — Ambulatory Visit
Admission: EM | Admit: 2022-05-31 | Discharge: 2022-05-31 | Disposition: A | Discharge status: Home / Self Care | Payer: 59

## 2022-05-31 DIAGNOSIS — L2389 Allergic contact dermatitis due to other agents: Secondary | ICD-10-CM | POA: Diagnosis not present

## 2022-05-31 DIAGNOSIS — H6123 Impacted cerumen, bilateral: Secondary | ICD-10-CM

## 2022-05-31 DIAGNOSIS — H60333 Swimmer's ear, bilateral: Secondary | ICD-10-CM

## 2022-05-31 MED ORDER — CETIRIZINE HCL 10 MG PO TABS: 10.0000 mg | ORAL_TABLET | Freq: Two times a day (BID) | ORAL | 0 refills | Status: AC

## 2022-05-31 MED ORDER — TRIAMCINOLONE ACETONIDE 0.5 % EX CREA: 1.0000 | TOPICAL_CREAM | Freq: Two times a day (BID) | CUTANEOUS | 1 refills | Status: AC

## 2022-05-31 MED ORDER — CIPROFLOXACIN-DEXAMETHASONE 0.3-0.1 % OT SUSP: 4.0000 [drp] | Freq: Two times a day (BID) | OTIC | 0 refills | Status: AC

## 2022-05-31 MED ORDER — DEXAMETHASONE SODIUM PHOSPHATE 10 MG/ML IJ SOLN
10.0000 mg | Freq: Once | INTRAMUSCULAR | Status: AC
  Administered 2022-05-31: 10 mg via INTRAMUSCULAR

## 2022-05-31 NOTE — ED Provider Notes (Signed)
[facility]    CSN: [id number] Arrival date & time: 05/31/22  1306    HISTORY   Chief Complaint  Patient presents with   Rash   HPI Chad Burke is a pleasant, 41 y.o. male who presents to urgent care today. Patient complains of getting water in his ears at the beach, states initially he had a lot of pain in his left ear but this is since resolved however still feels like his ear is clogged and he can hear very little.  Patient states he wears earplugs to sleep at night every night.  Patient also complains of a rash on his left upper body his posterior left axilla radiating down the posterior medial aspect of the left upper arm.  Patient states the rash is equally itchy and burning, appeared 5 days ago, has not responded to hydrocortisone cream that his wife has been applying.  Patient states he did this with patient every day but is unaware that he came into contact with any infection or.  Patient denies allergies, hearing loss, fever, sore throat, otalgia.  The history is provided by the patient.   Past Medical History:  Diagnosis Date   Anxiety    Sleep difficulties    Patient Active Problem List   Diagnosis Date Noted   Chronic insomnia 06/22/2011   History reviewed. No pertinent surgical history.  Home Medications    Prior to Admission medications   Medication Sig Start Date End Date Taking? Authorizing Provider  clonazePAM (KLONOPIN) 1 MG tablet Take 1 mg by mouth 2 (two) times daily. 04/21/22  Yes [provider]  methylphenidate (RITALIN) 10 MG tablet Take 10 mg by mouth 2 (two) times daily. 04/30/22  Yes [provider]  FLUoxetine (PROZAC) 20 MG capsule Take 60 mg by mouth every morning. 05/21/22   [provider]    Family History Family History  Problem Relation Age of Onset   Diabetes Mother    Hypertension Mother    Anxiety disorder Mother    Hypertension Father    Hypothyroidism Sister    Social History Social  History   Tobacco Use   Smoking status: Former    Packs/day: 1.00    Years: 12.00    Total pack years: 12.00    Types: Cigarettes    Quit date: 11/08/2005    Years since quitting: 16.5   Smokeless tobacco: Never  Substance Use Topics   Alcohol use: Yes    Comment: social   Drug use: No   Allergies   Codeine and Prednisone  Review of Systems Review of Systems Pertinent findings revealed after performing a 14 point review of systems has been noted in the history of present illness.  Physical Exam Triage Vital Signs ED Triage Vitals  Enc Vitals Group     BP 09/04/21 0827 (!) 147/82     Pulse Rate 09/04/21 0827 72     Resp 09/04/21 0827 18     Temp 09/04/21 0827 98.3 F (36.8 C)     Temp Source 09/04/21 0827 Oral     SpO2 09/04/21 0827 98 %     Weight --      Height --      Head Circumference --      Peak Flow --      Pain Score 09/04/21 0826 5     Pain Loc --      Pain Edu? --      Excl. in [place]? --  No data found.  Updated Vital Signs BP 117/76 (BP Location: Left Arm)   Pulse (!) 57   Temp 98.5 F (36.9 C) (Oral)   Resp 18   SpO2 98%   Physical Exam Vitals and nursing note reviewed.  Constitutional:      General: He is not in acute distress.    Appearance: Normal appearance. He is not ill-appearing.  HENT:     Head: Normocephalic and atraumatic.     Salivary Glands: Right salivary gland is not diffusely enlarged or tender. Left salivary gland is not diffusely enlarged or tender.     Right Ear: Hearing, tympanic membrane, ear canal and external ear normal. No drainage. No middle ear effusion. There is impacted cerumen. Tympanic membrane is not erythematous or bulging.     Left Ear: Hearing, tympanic membrane, ear canal and external ear normal. No drainage.  No middle ear effusion. There is impacted cerumen. Tympanic membrane is not erythematous or bulging.     Nose: Nose normal. No nasal deformity, septal deviation, mucosal edema, congestion or rhinorrhea.      Right Turbinates: Not enlarged, swollen or pale.     Left Turbinates: Not enlarged, swollen or pale.     Right Sinus: No maxillary sinus tenderness or frontal sinus tenderness.     Left Sinus: No maxillary sinus tenderness or frontal sinus tenderness.     Mouth/Throat:     Lips: Pink. No lesions.     Mouth: Mucous membranes are moist. No oral lesions.     Pharynx: Oropharynx is clear. Uvula midline. No posterior oropharyngeal erythema or uvula swelling.     Tonsils: No tonsillar exudate. 0 on the right. 0 on the left.  Eyes:     General: Lids are normal.        Right eye: No discharge.        Left eye: No discharge.     Extraocular Movements: Extraocular movements intact.     Conjunctiva/sclera: Conjunctivae normal.     Right eye: Right conjunctiva is not injected.     Left eye: Left conjunctiva is not injected.  Neck:     Trachea: Trachea and phonation normal.  Cardiovascular:     Rate and Rhythm: Normal rate and regular rhythm.     Pulses: Normal pulses.     Heart sounds: Normal heart sounds. No murmur heard.    No friction rub. No gallop.  Pulmonary:     Effort: Pulmonary effort is normal. No accessory muscle usage, prolonged expiration or respiratory distress.     Breath sounds: Normal breath sounds. No stridor, decreased air movement or transmitted upper airway sounds. No decreased breath sounds, wheezing, rhonchi or rales.  Chest:     Chest wall: No tenderness.  Musculoskeletal:        General: Normal range of motion.     Cervical back: Full passive range of motion without pain, normal range of motion and neck supple. Normal range of motion.  Lymphadenopathy:     Cervical: No cervical adenopathy.  Skin:    General: Skin is warm and dry.     Findings: No erythema or rash.  Neurological:     General: No focal deficit present.     Mental Status: He is alert and oriented to person, place, and time.  Psychiatric:        Mood and Affect: Mood normal.        Behavior:  Behavior normal.     Visual Acuity Right Eye Distance:  Left Eye Distance:   Bilateral Distance:    Right Eye Near:   Left Eye Near:    Bilateral Near:     [facility] Couse / Diagnostics / Procedures:     Radiology No results found.  Procedures Ear Cerumen Removal  Date/Time: 05/31/2022 3:10 PM  Performed by: Johna Sheriff, RN Authorized by: Lynden Oxford Scales, PA-C   Consent:    Consent obtained:  Verbal   Consent given by:  Patient   Risks, benefits, and alternatives were discussed: yes     Risks discussed:  Bleeding, dizziness, infection, incomplete removal, pain and TM perforation   Alternatives discussed:  No treatment, delayed treatment, alternative treatment, observation and referral Universal protocol:    Procedure explained and questions answered to patient or proxy's satisfaction: yes     Patient identity confirmed:  Verbally with patient and arm band Procedure details:    Location:  L ear and R ear   Procedure type: irrigation     Procedure outcomes: cerumen removed   Post-procedure details:    Inspection:  Ear canal clear, no bleeding, TM intact and macerated skin (Left EAC with erythema)   Hearing quality:  Normal   Procedure completion:  Tolerated  (including critical care time) EKG  Pending results:  Labs Reviewed - No data to display  Medications Ordered in UC: Medications  dexamethasone (DECADRON) injection 10 mg (10 mg Intramuscular Given 05/31/22 1534)    UC Diagnoses / Final Clinical Impressions(s)   I have reviewed the triage vital signs and the nursing notes.  Pertinent labs & imaging results that were available during my care of the patient were reviewed by me and considered in my medical decision making (see chart for details).    Final diagnoses:  Allergic contact dermatitis due to other agents  Bilateral impacted cerumen  Acute swimmer's ear of both sides   Bilateral EACs with mild maceration likely secondary to something water  trapped behind wax and wearing earplugs at night.  Patient provided with prescription for Ciprodex eardrops and advised to use them for 3 to 4 days to ensure no infection since then.  Patient provided with Zyrtec and triamcinolone cream along with dexamethasone to help resolve the rash under his arm which is likely secondary to coming in contact with something in the ocean given the pattern, patient reassured that I do not think that it is shingles.  Return precautions advised. ED Prescriptions     Medication Sig Dispense Auth. Provider   cetirizine (ZYRTEC ALLERGY) 10 MG tablet Take 1 tablet (10 mg total) by mouth 2 (two) times daily. 60 tablet Lynden Oxford Scales, PA-C   triamcinolone cream (KENALOG) 0.5 % Apply 1 Application topically 2 (two) times daily. Apply to affected area(s) twice daily , do not apply to face. 30 g Lynden Oxford Scales, PA-C   ciprofloxacin-dexamethasone (CIPRODEX) OTIC suspension Place 4 drops into both ears 2 (two) times daily. X 7 days 7.5 mL Lynden Oxford Scales, PA-C      I have reviewed the PDMP during this encounter.  Pending results:  Labs Reviewed - No data to display  Discharge Instructions:   Discharge Instructions      Based on the distribution and appearance of your rash, I think it is likely that you came into contact with something in the ocean and that caused your skin to react.  Please begin Zyrtec 1 tablet twice daily and triamcinolone cream twice daily to help resolve the itchiness and redness of your  skin.  Please discontinue triamcinolone cream once you see the redness is improved.  Please decrease your dose of Zyrtec to once daily once you see the redness is improved and continue taking 1 daily to complete a full 30-day course, last dose August 23.  If you do not see meaningful improvement of the itchiness, the burning of the redness after 10 days of treatment, please return for repeat evaluation or follow-up with your primary care  provider.  Because the skin in your left ear canal is very red and macerated and the skin in your right ear canal is also macerated, I recommend that you begin using Ciprodex eardrops twice daily for the next 3-4 days.  Please instill 4 drops into each ear, each time.  Because you wear earplugs at night, this will help prevent you from developing external ear infections after getting ocean water in your ears.  Thank you for visiting urgent care today.         Disposition Upon Discharge:  Condition: stable for discharge home  Patient presented with an acute illness with associated systemic symptoms and significant discomfort requiring urgent management. In my opinion, this is a condition that a prudent lay person (someone who possesses an average knowledge of health and medicine) may potentially expect to result in complications if not addressed urgently such as respiratory distress, impairment of bodily function or dysfunction of bodily organs.   Routine symptom specific, illness specific and/or disease specific instructions were discussed with the patient and/or caregiver at length.   As such, the patient has been evaluated and assessed, work-up was performed and treatment was provided in alignment with urgent care protocols and evidence based medicine.  Patient/parent/caregiver has been advised that the patient may require follow up for further testing and treatment if the symptoms continue in spite of treatment, as clinically indicated and appropriate.  Patient/parent/caregiver has been advised to return to the [facility] or PCP if no better; to PCP or the Emergency Department if new signs and symptoms develop, or if the current signs or symptoms continue to change or worsen for further workup, evaluation and treatment as clinically indicated and appropriate  The patient will follow up with their current PCP if and as advised. If the patient does not currently have a PCP we will assist them in  obtaining one.   The patient may need specialty follow up if the symptoms continue, in spite of conservative treatment and management, for further workup, evaluation, consultation and treatment as clinically indicated and appropriate.   Patient/parent/caregiver verbalized understanding and agreement of plan as discussed.  All questions were addressed during visit.  Please see discharge instructions below for further details of plan.  This office note has been dictated using Museum/gallery curator.  Unfortunately, this method of dictation can sometimes lead to typographical or grammatical errors.  I apologize for your inconvenience in advance if this occurs.  Please do not hesitate to reach out to me if clarification is needed.      Lynden Oxford Scales, [place] 06/01/22 [phone]

## 2022-05-31 NOTE — Discharge Instructions (Addendum)
Based on the distribution and appearance of your rash, I think it is likely that you came into contact with something in the ocean and that caused your skin to react.  Please begin Zyrtec 1 tablet twice daily and triamcinolone cream twice daily to help resolve the itchiness and redness of your skin.  Please discontinue triamcinolone cream once you see the redness is improved.  Please decrease your dose of Zyrtec to once daily once you see the redness is improved and continue taking 1 daily to complete a full 30-day course, last dose August 23.  If you do not see meaningful improvement of the itchiness, the burning of the redness after 10 days of treatment, please return for repeat evaluation or follow-up with your primary care provider.  Because the skin in your left ear canal is very red and macerated and the skin in your right ear canal is also macerated, I recommend that you begin using Ciprodex eardrops twice daily for the next 3-4 days.  Please instill 4 drops into each ear, each time.  Because you wear earplugs at night, this will help prevent you from developing external ear infections after getting ocean water in your ears.  Thank you for visiting urgent care today.

## 2022-05-31 NOTE — ED Triage Notes (Signed)
The patients wife noticed a rash to his left upper arm on Thursday.   Home interventions: cortisone cream  The patient states he has clogging sensation in his left ear.

## 2022-06-02 ENCOUNTER — Encounter: Payer: Self-pay | Admitting: Emergency Medicine

## 2022-06-02 ENCOUNTER — Ambulatory Visit: Payer: 59 | Admitting: Psychiatry

## 2022-06-02 ENCOUNTER — Ambulatory Visit
Admission: EM | Admit: 2022-06-02 | Discharge: 2022-06-02 | Disposition: A | Discharge status: Home / Self Care | Payer: 59 | Attending: Urgent Care | Admitting: Urgent Care

## 2022-06-02 DIAGNOSIS — H6123 Impacted cerumen, bilateral: Secondary | ICD-10-CM | POA: Diagnosis not present

## 2022-06-02 MED ORDER — CARBAMIDE PEROXIDE 6.5 % OT SOLN: 5.0000 [drp] | Freq: Every day | OTIC | 0 refills | Status: AC

## 2022-06-02 NOTE — ED Provider Notes (Signed)
[facility]   MRN: [id number] DOB: March 04, 1981  Subjective:   Chad Burke is a 41 y.o. male presenting for recheck on bilateral ear fullness.  Patient reports decreased hearing.  He was seen 05/31/2022.  He underwent a bilateral ear lavage.  This helped him temporarily until this morning when he felt his ears clogged up again.  Patient does use earplugs nightly as he has a very difficult time sleeping.  His work with multiple sleep specialists at ultimately the only thing that has helped his earplugs.  He does not intend on stopping this.  From his last visit he was also using Ciprodex which she is not sure if he should continue.  Denies any particular pain, overt drainage, bleeding, fevers.  No current facility-administered medications for this encounter.  Current Outpatient Medications:    cetirizine (ZYRTEC ALLERGY) 10 MG tablet, Take 1 tablet (10 mg total) by mouth 2 (two) times daily., Disp: 60 tablet, Rfl: 0   ciprofloxacin-dexamethasone (CIPRODEX) OTIC suspension, Place 4 drops into both ears 2 (two) times daily. X 7 days, Disp: 7.5 mL, Rfl: 0   clonazePAM (KLONOPIN) 1 MG tablet, Take 1 mg by mouth 2 (two) times daily., Disp: , Rfl:    FLUoxetine (PROZAC) 20 MG capsule, Take 60 mg by mouth every morning., Disp: , Rfl:    methylphenidate (RITALIN) 10 MG tablet, Take 10 mg by mouth 2 (two) times daily., Disp: , Rfl:    triamcinolone cream (KENALOG) 0.5 %, Apply 1 Application topically 2 (two) times daily. Apply to affected area(s) twice daily , do not apply to face., Disp: 30 g, Rfl: 1   Allergies  Allergen Reactions   Codeine     GI upset   Prednisone Other (See Comments)    Patient states it interacts with his depression medication, does not tolerate prednisone for this reason    Past Medical History:  Diagnosis Date   Anxiety    Sleep difficulties      History reviewed. No pertinent surgical history.  Family History  Problem Relation Age of  Onset   Diabetes Mother    Hypertension Mother    Anxiety disorder Mother    Hypertension Father    Hypothyroidism Sister     Social History   Tobacco Use   Smoking status: Former    Packs/day: 1.00    Years: 12.00    Total pack years: 12.00    Types: Cigarettes    Quit date: 11/08/2005    Years since quitting: 16.5   Smokeless tobacco: Never  Substance Use Topics   Alcohol use: Yes    Comment: social   Drug use: No    ROS   Objective:   Vitals: BP 127/82   Pulse 73   Temp 98.6 F (37 C)   Resp 20   SpO2 98%   Physical Exam Constitutional:      General: He is not in acute distress.    Appearance: Normal appearance. He is well-developed and normal weight. He is not ill-appearing, toxic-appearing or diaphoretic.  HENT:     Head: Normocephalic and atraumatic.     Right Ear: Tympanic membrane, ear canal and external ear normal. There is impacted cerumen.     Left Ear: Tympanic membrane, ear canal and external ear normal. There is impacted cerumen.     Nose: Nose normal.     Mouth/Throat:     Pharynx: Oropharynx is clear.  Eyes:     General: No  scleral icterus.       Right eye: No discharge.        Left eye: No discharge.     Extraocular Movements: Extraocular movements intact.  Cardiovascular:     Rate and Rhythm: Normal rate.  Pulmonary:     Effort: Pulmonary effort is normal.  Musculoskeletal:     Cervical back: Normal range of motion.  Neurological:     Mental Status: He is alert and oriented to person, place, and time.  Psychiatric:        Mood and Affect: Mood normal.        Behavior: Behavior normal.        Thought Content: Thought content normal.        Judgment: Judgment normal.     Ear lavage performed using mixture of peroxide and water.  Pressure irrigation performed using a bottle and a thin ear tube.  Bilateral ear lavage.  No curette was used.   Assessment and Plan :   PDMP not reviewed this encounter.  1. Bilateral impacted cerumen     Successful bilateral ear lavage.  General management of cerumen impaction reviewed with patient.  Anticipatory guidance provided, recommended regular use of Debrox especially given patient's difficulty with insomnia and the fact that the earplugs are the only thing that helps him.  Counseled patient on potential for adverse effects with medications prescribed/recommended today, ER and return-to-clinic precautions discussed, patient verbalized understanding.     Jaynee Eagles, PA-C 06/02/22 1345

## 2022-06-02 NOTE — ED Triage Notes (Signed)
Pt was seen here on 7/24 for ear fullness. His ears were cleaned out and prescribed drops. Since the drops were started, patient has clogged ears again and cannot hear.

## 2022-06-07 ENCOUNTER — Other Ambulatory Visit: Payer: 59

## 2022-06-11 ENCOUNTER — Ambulatory Visit
Admission: RE | Admit: 2022-06-11 | Discharge: 2022-06-11 | Disposition: A | Discharge status: Home / Self Care | Payer: No Typology Code available for payment source | Source: Ambulatory Visit | Attending: Family Medicine | Admitting: Family Medicine

## 2022-06-11 DIAGNOSIS — Z0000 Encounter for general adult medical examination without abnormal findings: Secondary | ICD-10-CM

## 2022-06-16 ENCOUNTER — Ambulatory Visit (INDEPENDENT_AMBULATORY_CARE_PROVIDER_SITE_OTHER): Payer: 59 | Admitting: Psychiatry

## 2022-06-16 DIAGNOSIS — M545 Low back pain, unspecified: Secondary | ICD-10-CM

## 2022-06-16 DIAGNOSIS — G8929 Other chronic pain: Secondary | ICD-10-CM

## 2022-06-16 DIAGNOSIS — F341 Dysthymic disorder: Secondary | ICD-10-CM

## 2022-06-16 DIAGNOSIS — Z87898 Personal history of other specified conditions: Secondary | ICD-10-CM

## 2022-06-16 DIAGNOSIS — M25551 Pain in right hip: Secondary | ICD-10-CM

## 2022-06-16 DIAGNOSIS — F4010 Social phobia, unspecified: Secondary | ICD-10-CM | POA: Diagnosis not present

## 2022-06-16 DIAGNOSIS — F4001 Agoraphobia with panic disorder: Secondary | ICD-10-CM

## 2022-06-16 DIAGNOSIS — F902 Attention-deficit hyperactivity disorder, combined type: Secondary | ICD-10-CM | POA: Diagnosis not present

## 2022-06-16 DIAGNOSIS — Z8639 Personal history of other endocrine, nutritional and metabolic disease: Secondary | ICD-10-CM

## 2022-06-16 NOTE — Progress Notes (Signed)
Psychotherapy Progress Note [facility] Luan Moore, PhD LP  Patient ID: Chad Burke [facility])    MRN: [id number] Therapy format: Individual psychotherapy Date: 06/16/2022      Start: 2:15p     Stop: 3:00p     Time Spent: 45 min Location: In-person   Session narrative (presenting needs, interim history, self-report of stressors and symptoms, applications of prior therapy, status changes, and interventions made in session) Been to urgent care since last seen d/t ear rash and issues from beach water.  Has habit of earplugs at night, which impacted cerumen, cleared by urgent care.  Had calcium scoring done, months after scheduled (clerical error) and being redirected to a [place] testing center.  Amazed to find out that he has zero scores despite looming family history and high cholesterol testing.  Affirmed acts of self-care and blessing of knowing lower cardiac risk.  Did call [facility] to f/u, a month ago now, and still no callback in the month since.  Feeling disaffected about it now.  Looking at homes and apartments is also demoralizing for the prices, lack of income, and feeling inadequate as a provider.  Did some insurance work with father pre-vacation, which helped.  F offering now to get him trained/licensed in life insurance so he could offer more diverse services.  Not comfortable with it, but considering.  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  Has looked at listings but finds himself compulsively talking down any of them that begin to look plausible, imagining how either he or they would disappoint.  Supportively challenged about letting feelings pretend to be knowledge and offered to look together at listings in order to better comprehend and combat his automatic thoughts in the process.  Finds Ritalin still helps, even if he is somewhat more demoralized right now.  Taking IR formula, which means 3/day dosing and dealing with some let-off effects.  No c/o jitteriness or jonesing  for more.    Therapeutic modalities: Cognitive Behavioral Therapy, Solution-Oriented/Positive Psychology, and Ego-Supportive  Mental Status/Observations:  Appearance:   Casual     Behavior:  Appropriate  Motor:  Normal  Speech/Language:   Clear and Coherent  Affect:  Appropriate  Mood:  anxious and depressed  Thought process:  normal  Thought content:    Obsessions  Sensory/Perceptual disturbances:    WNL  Orientation:  Fully oriented  Attention:  Good    Concentration:  Good  Memory:  WNL  Insight:    Good  Judgment:   Good  Impulse Control:  Good   Risk Assessment: Danger to Self: No Self-injurious Behavior: No Danger to Others: No Physical Aggression / Violence: No Duty to Warn: No Access to Firearms a concern: No  Assessment of progress:  situational setback(s)  Diagnosis:   ICD-10-CM   1. Social anxiety disorder  F40.10     2. Panic disorder with agoraphobia  F40.01     3. Dysthymic disorder  F34.1     4. Attention deficit hyperactivity disorder (ADHD), combined type  F90.2     5. History of suspected LD, known substance use long sober  Z87.898     6. History of vitamin D deficiency  Z86.39     7. Chronic low back pain, unspecified back pain laterality, unspecified whether sciatica present  M54.50    G89.29     8. Pain of right hip joint  M25.551      Plan:  Next session, look over job listings together for assessment, practice tolerating  distress, and potential matches for work.  Practice suspending judgment instead of catastrophizing. Continue sheltered work wit father as available, consider certifying for life insurance, and solicit  Solicit more opportunities to make insurance calls, do scheduling calls for them, and to role play handling customer questions with father.  If supply of insurance work is too limited, option to see about loaning out to other [employer] reps if they could use a deputy/partner to work accounts. Put down worry over doing more  anxiety-challenging aspects of insurance work, just build on what's comfortable enough, repeat till no need of Klonopin and expand tasks Recommend Super B complex for nutritional balance, stress effects, and alertness Most likely recommend omega 3 for suspected cholesterol issue and general antiinflammation Continue generally, for all social and performance anxiety exposures, continue to endorse an attitude of going in anyway and capitalizing on opportunities to break the cycle of obsessing and catastrophizing ahead of time Practice diaphragmatic breathing and paced breathing techniques for anxiety control, for eventual use as a "fire extinguisher" for kindling panic For sleep -- melatonin timing about 2 hours before bedtime, as tolerated; may use amber glasses to help set internal clock; continue other measures as needed and consult on sleep hygiene as needed. Other recommendations/advice as may be noted above Continue to utilize previously learned skills ad lib Maintain medication as prescribed and work faithfully with relevant prescriber(s) if any changes are desired or seem indicated Call the clinic on-call service, 988/hotline, 911, or present to [facility] or ER if any life-threatening psychiatric crisis Return for session(s) already scheduled. Already scheduled visit in this office 06/29/2022.  [name], PhD Luan Moore, PhD LP Clinical Psychologist, [facility] [address] [place], [place] [address] [phone]

## 2022-06-29 ENCOUNTER — Ambulatory Visit (INDEPENDENT_AMBULATORY_CARE_PROVIDER_SITE_OTHER): Payer: 59 | Admitting: Psychiatry

## 2022-06-29 DIAGNOSIS — F4010 Social phobia, unspecified: Secondary | ICD-10-CM | POA: Diagnosis not present

## 2022-06-29 DIAGNOSIS — F5104 Psychophysiologic insomnia: Secondary | ICD-10-CM

## 2022-06-29 DIAGNOSIS — Z8639 Personal history of other endocrine, nutritional and metabolic disease: Secondary | ICD-10-CM

## 2022-06-29 DIAGNOSIS — F902 Attention-deficit hyperactivity disorder, combined type: Secondary | ICD-10-CM

## 2022-06-29 DIAGNOSIS — F341 Dysthymic disorder: Secondary | ICD-10-CM

## 2022-06-29 DIAGNOSIS — Z87898 Personal history of other specified conditions: Secondary | ICD-10-CM

## 2022-06-29 DIAGNOSIS — F4001 Agoraphobia with panic disorder: Secondary | ICD-10-CM

## 2022-06-29 NOTE — Progress Notes (Signed)
Psychotherapy Progress Note [facility] Luan Moore, PhD LP  Patient ID: Chad Burke [facility])    MRN: [id number] Therapy format: Individual psychotherapy Date: 06/29/2022      Start: 2:10p     Stop: 3:00p     Time Spent: 50 min Location: In-person   Session narrative (presenting needs, interim history, self-report of stressors and symptoms, applications of prior therapy, status changes, and interventions made in session) More demoralized of late.  Knows he lets little things build up, and keeps things close to the vest trying not to bother people in his life.  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  Looking at new insurance, finding out it's hard to find both their doctors and medicines covered well.  Getting more demoralized.  Reviewed principles and possibilities, though hard time feeling them.  Further encouraged in breaking down tasks, doing what's available, building on work with father, etc.  Acknowledges inconsistent deep sleep measurements on his watch, which could definitely have an effect on concentration, morale, and distress tolerance.    Question about hx of Ecstasy and cocaine use -- may be behind some of his brain effects as well. Notes he is on '60mg'$  Prozac already, which seemed to help early then fade.  Possibility that motivational centers were adversely affected such that serotonin action is not that reliable.  If there are medication approaches, recommend consult psychiatric NP.    Therapeutic modalities: Cognitive Behavioral Therapy, Solution-Oriented/Positive Psychology, Customer service manager, and Motivational Interviewing  Mental Status/Observations:  Appearance:   Casual     Behavior:  Appropriate and demotivated  Motor:  Normal  Speech/Language:   Clear and Coherent  Affect:  Appropriate  Mood:  depressed  Thought process:  normal  Thought content:    worry  Sensory/Perceptual disturbances:    WNL  Orientation:  Fully oriented   Attention:  Good    Concentration:  Good  Memory:  WNL  Insight:    Good  Judgment:   Good  Impulse Control:  Good   Risk Assessment: Danger to Self: No Self-injurious Behavior: No Danger to Others: No Physical Aggression / Violence: No Duty to Warn: No Access to Firearms a concern: No  Assessment of progress:  situational setback(s)  Diagnosis:   ICD-10-CM   1. Social anxiety disorder  F40.10     2. Panic disorder with agoraphobia  F40.01     3. Dysthymic disorder  F34.1     4. Chronic insomnia  F51.04     5. Attention deficit hyperactivity disorder (ADHD), combined type  F90.2     6. History of vitamin D deficiency  Z86.39     7. History of suspected LD, known substance use long sober  Z87.898      Plan:  Cognitive and attention issues  Check with psychiatry whether hx of drug abuse and current doldrums recommend an earlier change in dosing or strategy with antidepressant.  TX agrees to message, in case PT forgets. Recommend Super B complex for nutritional balance, stress effects, and alertness Most likely recommend omega 3 for suspected cholesterol issue and general antiinflammation Sleep melatonin timing about 2 hours before bedtime, as tolerated; may use amber glasses to help set internal clock continue other measures as needed and consult on sleep hygiene as needed. Worry thoughts and naysaying Pay attention to conclusion-jumping thoughts and try to call "time out".   Resolve to let things be possible, even if they are hard, or anxiety-provoking.  Admit not knowing what "will"  go wrong. Emotional expression and support -- Take wife up on the offer to hear out how he feels, what he's struggling with -- she said it doesn't have to be alone, accept the gift of support Work-seeking Continue sheltered work with father as available, consider certifying for life insurance, and solicit  Solicit more opportunities to make insurance calls, do scheduling calls for them,  and to role play handling customer questions with father.  If supply of insurance work is too limited, option to see about loaning out to other [employer] reps if they could use a deputy/partner to work accounts. Diligence -- Aim for "a little something" done, showing up some amount.   Offer remains to look over job listings together for practice tolerating distress and assessment of work-seeking issues and potential matches for work.   Put down worry over doing more anxiety-challenging aspects of insurance work, just build on what's comfortable enough, repeat till no need of Klonopin and expand tasks Anxiety exposure Continue generally, for all social and performance anxiety exposures, continue to endorse an attitude of going in anyway and capitalizing on opportunities to break the cycle of obsessing and catastrophizing ahead of time Practice diaphragmatic breathing and paced breathing techniques for anxiety control, for eventual use as a "fire extinguisher" for kindling panic Other recommendations/advice as may be noted above Continue to utilize previously learned skills ad lib Maintain medication as prescribed and work faithfully with relevant prescriber(s) if any changes are desired or seem indicated Call the clinic on-call service, 988/hotline, 911, or present to [facility] or ER if any life-threatening psychiatric crisis Return for session(s) already scheduled. Already scheduled visit in this office 07/13/2022.  [name], PhD Luan Moore, PhD LP Clinical Psychologist, [facility] [address], [place], [place] [address] [phone]

## 2022-07-13 ENCOUNTER — Ambulatory Visit: Payer: 59 | Admitting: Psychiatry

## 2022-07-28 ENCOUNTER — Ambulatory Visit (INDEPENDENT_AMBULATORY_CARE_PROVIDER_SITE_OTHER): Payer: Commercial Managed Care - HMO | Admitting: Psychiatry

## 2022-07-28 DIAGNOSIS — F902 Attention-deficit hyperactivity disorder, combined type: Secondary | ICD-10-CM | POA: Diagnosis not present

## 2022-07-28 DIAGNOSIS — Z8639 Personal history of other endocrine, nutritional and metabolic disease: Secondary | ICD-10-CM

## 2022-07-28 DIAGNOSIS — F341 Dysthymic disorder: Secondary | ICD-10-CM

## 2022-07-28 DIAGNOSIS — F4010 Social phobia, unspecified: Secondary | ICD-10-CM

## 2022-07-28 DIAGNOSIS — F4001 Agoraphobia with panic disorder: Secondary | ICD-10-CM | POA: Diagnosis not present

## 2022-07-28 DIAGNOSIS — Z87898 Personal history of other specified conditions: Secondary | ICD-10-CM

## 2022-07-28 NOTE — Progress Notes (Signed)
Psychotherapy Progress Note [facility] Luan Moore, PhD LP  Patient ID: GWYNN CROSSLEY [facility])    MRN: [id number] Therapy format: Individual psychotherapy Date: 07/28/2022      Start: 2:15p     Stop: 3:05p     Time Spent: 50 min Location: In-person   Session narrative (presenting needs, interim history, self-report of stressors and symptoms, applications of prior therapy, status changes, and interventions made in session) On new marketplace insurance, pretty sure his cost is $20.  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  Household just went through [employer], which was fatiguing.  Overall, still dampened mentally, emotionally.  Knows he was taken off Wellbutrin about 3 months ago, when he went on methylphenidate, still on Prozac '60mg'$ .  Did not ask for himself to see psychiatry early, convinced himself TX was going to ask for him and spare him asking.  But did call yesterday, asking if he could resume Wellbutrin.  Affirmed asserting his own role as the patient.  A lot of stress overall.  Jarrett Soho began preschool, started a week after other kids d/t COVID, then had some difficulties tolerating the new environment, working through re-entry.  Vocationally, nothing further with Voc Rehab.  Cleaned carpet this morning, insurance event tomorrow.  Constitutionally, feeling the seasonal change, with light slipping away fastest right now.    Asks about GABA supplementation.  Not sure, referred back to psychiatry.  Discussed possibility his drug history changed the way his brain would respond to meds, also a good question for psychiatry.  Therapeutic modalities: Cognitive Behavioral Therapy and Solution-Oriented/Positive Psychology  Mental Status/Observations:  Appearance:   Casual     Behavior:  Appropriate  Motor:  Normal  Speech/Language:   Clear and Coherent  Affect:  Appropriate   Mood:  anxious and dysthymic  Thought process:  normal  Thought content:    WNL and worry  Sensory/Perceptual disturbances:    WNL  Orientation:  Fully oriented  Attention:  Good    Concentration:  Good  Memory:  WNL  Insight:    Good  Judgment:   Good  Impulse Control:  Fair   Risk Assessment: Danger to Self: No Self-injurious Behavior: No Danger to Others: No Physical Aggression / Violence: No Duty to Warn: No Access to Firearms a concern: No  Assessment of progress:  progressing  Diagnosis:   ICD-10-CM   1. Panic disorder with agoraphobia  F40.01     2. Social anxiety disorder  F40.10     3. Dysthymic disorder  F34.1     4. Attention deficit hyperactivity disorder (ADHD), combined type  F90.2     5. History of vitamin D deficiency  Z86.39     6. History of suspected LD, known substance use long sober  Z87.898      Plan:  Meds/nutrition Ask psychiatry about restoring Wellbutrin and whether drug hx affects treatment choices Advocate vitamin D Recommend Super B complex for nutritional balance, stress effects, and alertness Most likely recommend omega 3 for suspected cholesterol issue and general antiinflammation Sleep melatonin timing about 2 hours before bedtime, as tolerated; may use amber glasses to help set internal clock continue other measures as needed and consult on sleep hygiene as needed. Emotional expression and support -- Take wife up on the offer to hear out how he feels, what he's struggling with -- she said it doesn't have to be alone, accept the gift of  support Work-seeking Continue sheltered work with father as available, consider certifying for life insurance Solicit more opportunities to [employer] calls, do scheduling calls for them, and to role play handling customer questions with father.  If supply of insurance work is too limited, option to see about loaning out to other [employer] reps if they could use a deputy/partner to work accounts. Put  down worry over doing more anxiety-challenging aspects of insurance work, just build on what's comfortable enough, repeat till no need of Klonopin and expand tasks Diligence -- Aim for "a little something" done, showing up some amount.   Voc Rehab if can get the process working Offer remains to look over job listings together for practice tolerating distress and for closer assessment of work-seeking issues and potential matches for work. Anxiety/worry exposure Continue generally, for all social and performance anxiety exposures, continue to endorse an attitude of going in anyway and capitalizing on opportunities to break the cycle of obsessing and catastrophizing ahead of time Practice diaphragmatic breathing and paced breathing techniques for anxiety control, for eventual use as a "fire extinguisher" for kindling panic Pay attention to conclusion-jumping thoughts and try to call "time out".   Resolve to let things be possible, even if they are hard, or anxiety-provoking.  Admit not knowing what "will" go wrong. Other recommendations/advice as may be noted above Continue to utilize previously learned skills ad lib Maintain medication as prescribed and work faithfully with relevant prescriber(s) if any changes are desired or seem indicated Call the clinic on-call service, 988/hotline, 911, or present to [facility] or ER if any life-threatening psychiatric crisis No follow-ups on file. Already scheduled visit in this office 08/10/2022.  [name], PhD Luan Moore, PhD LP Clinical Psychologist, [facility] [address], [place], [place] [address] [phone]

## 2022-08-10 ENCOUNTER — Ambulatory Visit (INDEPENDENT_AMBULATORY_CARE_PROVIDER_SITE_OTHER): Payer: Commercial Managed Care - HMO | Admitting: Psychiatry

## 2022-08-10 DIAGNOSIS — Z8639 Personal history of other endocrine, nutritional and metabolic disease: Secondary | ICD-10-CM

## 2022-08-10 DIAGNOSIS — F341 Dysthymic disorder: Secondary | ICD-10-CM

## 2022-08-10 DIAGNOSIS — F4010 Social phobia, unspecified: Secondary | ICD-10-CM

## 2022-08-10 DIAGNOSIS — F902 Attention-deficit hyperactivity disorder, combined type: Secondary | ICD-10-CM

## 2022-08-10 DIAGNOSIS — F4001 Agoraphobia with panic disorder: Secondary | ICD-10-CM

## 2022-08-10 DIAGNOSIS — Z87898 Personal history of other specified conditions: Secondary | ICD-10-CM

## 2022-08-10 NOTE — Progress Notes (Signed)
Psychotherapy Progress Note [facility] Luan Moore, PhD LP  Patient ID: Chad Burke [facility])    MRN: [id number] Therapy format: Individual psychotherapy Date: 08/10/2022      Start: 2:14p     Stop: 3:00p     Time Spent: 46 min Location: In-person   Session narrative (presenting needs, interim history, self-report of stressors and symptoms, applications of prior therapy, status changes, and interventions made in session) Reports 19yo stepson has hx of ADHD, discomfort with meds, went to [facility], fell behind, may be re-interested in treating it.  Alternatives offered.  Since last time, did call back to his psychiatrist, medication adjustment made to reinstate Wellbutrin.  Now on Auvelity (bupropion + dextromethorphan), after going through preauth.  Feeling immediately better.  Went though tolerable dizziness/lightheadedness, now a week into it and settling in well.    Voc Rehab story turns out he got a call from our contact, Jens Som, who told him he was actually approved for services in June, but he never got a letter to that effect and they thought he was declining to engage.  Did get a meeting soon after, and will be getting career counselor at [employer] after he signs a plan document.  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  Familiar customers, had a speaking part in insurance, felt it went smoothly, no intense self-doubt.  Affirmed and encouraged.  Jarrett Soho started preschool, as noted before.  Materials engineer memory for last 2 sessions, better now.)  Word she had a full tantrum 3rd day in, then she seemed to respond to fading in attendance starting 2/wk, then 3/wk.  [name] has had complex medical problems, with her mother dying early of lupus, and [name] herself being a Lyme survivor and going through active EBV.  Now assessed with reduced kidney function and  referral to nephrology, plus an indication of Sjogren's.  Has seen her energy rise and fall.  Relationship solid, just mutual worry about making ends meet, but that's looking suddenly better with breakthroughs in medicine and vocational help.  Therapeutic modalities: Cognitive Behavioral Therapy and Solution-Oriented/Positive Psychology  Mental Status/Observations:  Appearance:   Casual     Behavior:  Appropriate  Motor:  Normal  Speech/Language:   Clear and Coherent  Affect:  Appropriate and notably brighter  Mood:  anxious and much less  Thought process:  normal  Thought content:    WNL  Sensory/Perceptual disturbances:    WNL  Orientation:  Fully oriented  Attention:  Good    Concentration:  Good  Memory:  WNL  Insight:    Good  Judgment:   Good  Impulse Control:  Good   Risk Assessment: Danger to Self: No Self-injurious Behavior: No Danger to Others: No Physical Aggression / Violence: No Duty to Warn: No Access to Firearms a concern: No  Assessment of progress:  progressing well  Diagnosis:   ICD-10-CM   1. Panic disorder with agoraphobia  F40.01     2. Social anxiety disorder  F40.10     3. Dysthymic disorder  F34.1     4. Attention deficit hyperactivity disorder (ADHD), combined type  F90.2     5. History of vitamin D deficiency  Z86.39     6. History of suspected LD, known substance use long sober  Z87.898      Plan:  Focal goal now to dig into insurance  work further the next 2 weeks, if able, and work on expanding the role, e.g., inputting data for enrollment, further speaking parts and Q&A Meds/nutrition Dealer vitamin D Recommend Super B complex for nutritional balance, stress effects, and alertness Most likely recommend omega 3 for suspected cholesterol issue and general antiinflammation Sleep melatonin timing about 2 hours before bedtime, as tolerated; may use amber glasses to help set internal clock continue other measures as  needed and consult on sleep hygiene as needed. Emotional expression and support -- Take wife up on the offer to hear out how he feels, what he's struggling with -- she said it doesn't have to be alone, accept the gift of support Work-seeking Continue sheltered work with father as available, consider certifying for life insurance Solicit more opportunities to make insurance calls, do scheduling calls for them, and to role play handling customer questions with father.  If supply of insurance work is too limited, option to see about loaning out to other [employer] reps if they could use a deputy/partner to work accounts. Put down worry over doing more anxiety-challenging aspects of insurance work, just build on what's comfortable enough, repeat till no need of Klonopin and expand tasks Diligence -- Aim for "a little something" done, showing up some amount.   Voc Rehab if can get the process working Offer remains to look over job listings together for practice tolerating distress and for closer assessment of work-seeking issues and potential matches for work. Anxiety/worry exposure Continue generally, for all social and performance anxiety exposures, continue to endorse an attitude of going in anyway and capitalizing on opportunities to break the cycle of obsessing and catastrophizing ahead of time Practice diaphragmatic breathing and paced breathing techniques for anxiety control, for eventual use as a "fire extinguisher" for kindling panic Pay attention to conclusion-jumping thoughts and try to call "time out".   Resolve to let things be possible, even if they are hard, or anxiety-provoking.  Admit not knowing what "will" go wrong. Other recommendations/advice as may be noted above Continue to utilize previously learned skills ad lib Maintain medication as prescribed and work faithfully with relevant prescriber(s) if any changes are desired or seem indicated Call the clinic on-call service, 988/hotline,  911, or present to [facility] or ER if any life-threatening psychiatric crisis Return for session(s) already scheduled. Already scheduled visit in this office 08/24/2022.  [name], PhD Luan Moore, PhD LP Clinical Psychologist, [facility] [address], [place], [place] [address] [phone]

## 2022-08-24 ENCOUNTER — Ambulatory Visit: Payer: 59 | Admitting: Psychiatry

## 2022-09-07 ENCOUNTER — Ambulatory Visit (INDEPENDENT_AMBULATORY_CARE_PROVIDER_SITE_OTHER): Payer: Commercial Managed Care - HMO | Admitting: Psychiatry

## 2022-09-07 DIAGNOSIS — Z87898 Personal history of other specified conditions: Secondary | ICD-10-CM

## 2022-09-07 DIAGNOSIS — Z8639 Personal history of other endocrine, nutritional and metabolic disease: Secondary | ICD-10-CM

## 2022-09-07 DIAGNOSIS — F902 Attention-deficit hyperactivity disorder, combined type: Secondary | ICD-10-CM | POA: Diagnosis not present

## 2022-09-07 DIAGNOSIS — F341 Dysthymic disorder: Secondary | ICD-10-CM

## 2022-09-07 DIAGNOSIS — Z569 Unspecified problems related to employment: Secondary | ICD-10-CM

## 2022-09-07 DIAGNOSIS — F4010 Social phobia, unspecified: Secondary | ICD-10-CM

## 2022-09-07 NOTE — Progress Notes (Signed)
Psychotherapy Progress Note [facility] Luan Moore, PhD LP  Patient ID: Chad Burke [facility])    MRN: [id number] Therapy format: Individual psychotherapy Date: 09/07/2022      Start: 2:05p     Stop: 2:50p     Time Spent: 45 min Location: In-person   Session narrative (presenting needs, interim history, self-report of stressors and symptoms, applications of prior therapy, status changes, and interventions made in session) Ready to sign off completely with Voc Rehab at this point.  Finding continuing disorganization there, failing to deliver information recently promised to him after learning they failed to contact him the first time.  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  Considering approaching an Estate manager/land agent of his [employer], a Estate manager/land agent mfg company.  Finds himself worrying some about whether he could handle industrial noise, or a big-box working environment.  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  Validated and discussed needs and preferences that target his work search Reiterates his need for physical mobility, his lifelong sense of restlessness he can now call [place], missing school and dropping out 9th grade.  Orthopedic issues seem to be flaring enough that industrial work could still be a hardship, but willing to look.  Brainstormed other ideas -- driving jobs like Surveyor, mining, snack chip delivery route, car delivery, and other cleaning jobs like detailing cars or airplanes between flights.  Also mixed jobs like junk/salvage work, e.g., Theatre stage manager.  Still possible to seek temp service, just to get feet wet, paycheck, and sample other possibilities.  Therapeutic modalities: Cognitive Behavioral Therapy and Solution-Oriented/Positive Psychology  Mental Status/Observations:  Appearance:   Casual      Behavior:  Appropriate  Motor:  Normal  Speech/Language:   Clear and Coherent  Affect:  Appropriate  Mood:  Approp to subject  Thought process:  normal  Thought content:    WNL  Sensory/Perceptual disturbances:    WNL  Orientation:  Fully oriented  Attention:  Good    Concentration:  Good  Memory:  WNL  Insight:    Good  Judgment:   Good  Impulse Control:  Good   Risk Assessment: Danger to Self: No Self-injurious Behavior: No Danger to Others: No Physical Aggression / Violence: No Duty to Warn: No Access to Firearms a concern: No  Assessment of progress:  progressing  Diagnosis:   ICD-10-CM   1. Panic disorder with agoraphobia  F40.01     2. Occupational problem  Z56.9     3. Social anxiety disorder  F40.10     4. Dysthymic disorder  F34.1     5. Attention deficit hyperactivity disorder (ADHD), combined type  F90.2     6. History of vitamin D deficiency  Z86.39     7. History of suspected LD, known substance use long sober  Z87.898      Plan:  Meds/nutrition Endorse Auvelity Advocate vitamin D Recommend Super B complex for nutritional balance, stress effects, and alertness Most likely recommend omega 3 for suspected cholesterol issue and general antiinflammation Sleep melatonin timing about 2 hours before bedtime, as tolerated; may use amber glasses to help set internal clock continue other measures as needed and consult on sleep hygiene as needed. Emotional expression and support  Take wife up on the offer to hear out how he feels, what he's struggling  with, accept the offer Work-seeking Continue sheltered work with father as available, consider certifying for life insurance Solicit more opportunities to make insurance calls, do scheduling calls for them, and to role play handling customer questions with father.  If supply of insurance work is too limited, option to see about loaning out to other [employer] reps if they could use a deputy/partner to work  accounts. Put down worry over doing more anxiety-challenging aspects of insurance work, just build on what's comfortable enough, repeat till no need of Klonopin and expand tasks Diligence -- Aim for "a little something" done, showing up some amount.   Voc Rehab if can get the process working Offer remains to look over job listings together for practice tolerating distress and for closer assessment of work-seeking issues and potential matches for work. Anxiety/worry exposure Continue generally, for all social and performance anxiety exposures, continue to endorse an attitude of going in anyway and capitalizing on opportunities to break the cycle of obsessing and catastrophizing ahead of time Practice diaphragmatic breathing and paced breathing techniques for anxiety control, for eventual use as a "fire extinguisher" for kindling panic Pay attention to conclusion-jumping thoughts and try to call "time out".   Resolve to let things be possible, even if they are hard, or anxiety-provoking.  Admit not knowing what "will" go wrong. Other recommendations/advice as may be noted above Continue to utilize previously learned skills ad lib Maintain medication as prescribed and work faithfully with relevant prescriber(s) if any changes are desired or seem indicated Call the clinic on-call service, 988/hotline, 911, or present to [facility] or ER if any life-threatening psychiatric crisis Return for as already scheduled. Already scheduled visit in this office 09/21/2022.  [name], PhD Luan Moore, PhD LP Clinical Psychologist, [facility] [address] [place], [place] [address] [phone]

## 2022-09-21 ENCOUNTER — Ambulatory Visit (INDEPENDENT_AMBULATORY_CARE_PROVIDER_SITE_OTHER): Payer: Commercial Managed Care - HMO | Admitting: Psychiatry

## 2022-09-21 DIAGNOSIS — F341 Dysthymic disorder: Secondary | ICD-10-CM | POA: Diagnosis not present

## 2022-09-21 DIAGNOSIS — F902 Attention-deficit hyperactivity disorder, combined type: Secondary | ICD-10-CM

## 2022-09-21 DIAGNOSIS — F4010 Social phobia, unspecified: Secondary | ICD-10-CM

## 2022-09-21 DIAGNOSIS — F4001 Agoraphobia with panic disorder: Secondary | ICD-10-CM

## 2022-09-21 DIAGNOSIS — Z87898 Personal history of other specified conditions: Secondary | ICD-10-CM

## 2022-09-21 NOTE — Progress Notes (Signed)
Psychotherapy Progress Note [facility] Luan Moore, PhD LP  Patient ID: Chad Burke [facility])    MRN: [id number] Therapy format: Individual psychotherapy Date: 09/21/2022      Start: 2:18p     Stop: 3:05p     Time Spent: 47 min Location: In-person   Session narrative (presenting needs, interim history, self-report of stressors and symptoms, applications of prior therapy, status changes, and interventions made in session) Dealing with a panic attack trying to happen at the outset.  C/o crowded WR and an overly expressive woman, had to get away from her.  Worked through Audiological scientist and stretching to ease up tension/anxiety.    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  Interview this morning, turns out they can employ him part time, doesn't have to be full, which would allow him to continue working some with his father, seems to be a godsend.  No nerves leading up to the meeting, good conversation this morning with the owner, interesting tour of the facility and learning what all goes into it, and favorable outlook to work with his preference for time.  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  Even though he has it clear in his mind, started undermining all the confidence he's gained.  To his credit, he asked her to stop, and to her credit, she eventually did.  Apparently a long history of mother being a worry wart and compulsively "helping" that way.  Before all that, too, [name] had activated anxiety by raising the issue what about if/when they want him to go full-time, which inadvertently started pressure to obsess about having to settle, give up time with father, fulfill expectations that might be too much, etc.    Acknowledged great work done  already today just weathering these anxiety challenges and panic triggers, and work done representing it to his mother when he needed her to lay off, disclosing to father when he needed to decompress, seeing through the job-seeking and interview ("huge", we agreed) and the serendipity (providence?) of having this appointment so close to time.  Assured that what he feels right now is post-adrenaline letdown, he is not set up to relapse in panic or in old pattern of drug use, he will still see through this opportunity to work and provide, and he will be able to kindly orient [name] to the better way of talking about work expectations.  Affirmed good signs that he is desired, already accepted, and trusted as a prospective employee and that he is already ready to adjust if for some reason the offer can't be worked out.  Therapeutic modalities: Cognitive Behavioral Therapy, Solution-Oriented/Positive Psychology, Customer service manager, and Faith-sensitive  Mental Status/Observations:  Appearance:   Neat     Behavior:  Appropriate  Motor:  Normal  Speech/Language:   Clear and Coherent  Affect:  Appropriate  Mood:  anxious and fighting panic early on  Thought process:  normal  Thought content:    WNL  Sensory/Perceptual disturbances:    WNL  Orientation:  Fully oriented  Attention:  Good, once grounded    Concentration:  Good  Memory:  WNL  Insight:    Good  Judgment:   Good  Impulse Control:  Good   Risk Assessment: Danger to Self: No  Self-injurious Behavior: No Danger to Others: No Physical Aggression / Violence: No Duty to Warn: No Access to Firearms a concern: No  Assessment of progress:  progressing well  Diagnosis:   ICD-10-CM   1. Panic disorder with agoraphobia  F40.01     2. Social anxiety disorder  F40.10     3. Dysthymic disorder - controlled  F34.1     4. Attention deficit hyperactivity disorder (ADHD), combined type  F90.2     5. History of suspected LD and known substance use,  long sober  Z87.898      Plan:  Self-affirm the confidences above Encourage to let [name] know how her own and mother's comments backfire and guide better.  Option to include in therapy PRN. Meds/nutrition Endorse Auvelity Advocate vitamin D Recommend Super B complex for nutritional balance, stress effects, and alertness Most likely recommend omega 3 for suspected cholesterol issue and general antiinflammation Sleep melatonin timing about 2 hours before bedtime, as tolerated; may use amber glasses to help set internal clock continue other measures as needed and consult on sleep hygiene as needed. Emotional expression and support  Take wife up on the offer to hear out how he feels, what he's struggling with, accept the offer Work-seeking Continue sheltered work with father as available, consider certifying for life insurance Solicit more opportunities to make insurance calls, do scheduling calls for them, and to role play handling customer questions with father.  If supply of insurance work is too limited, option to see about loaning out to other [employer] reps if they could use a deputy/partner to work accounts. Put down worry over doing more anxiety-challenging aspects of insurance work, just build on what's comfortable enough, repeat till no need of Klonopin and expand tasks Diligence -- Aim for "a little something" done, showing up some amount.   Voc Rehab if can get the process working Offer remains to look over job listings together for practice tolerating distress and for closer assessment of work-seeking issues and potential matches for work. Anxiety/worry exposure Continue generally, for all social and performance anxiety exposures, continue to endorse an attitude of going in anyway and capitalizing on opportunities to break the cycle of obsessing and catastrophizing ahead of time Practice diaphragmatic breathing and paced breathing techniques for anxiety control, for eventual use as a  "fire extinguisher" for kindling panic Pay attention to conclusion-jumping thoughts and try to call "time out".   Resolve to let things be possible, even if they are hard, or anxiety-provoking.  Admit not knowing what "will" go wrong. Other recommendations/advice as may be noted above Continue to utilize previously learned skills ad lib Maintain medication as prescribed and work faithfully with relevant prescriber(s) if any changes are desired or seem indicated Call the clinic on-call service, 988/hotline, 911, or present to [facility] or ER if any life-threatening psychiatric crisis Return for time as available. Already scheduled visit in this office 10/07/2022.  [name], PhD Luan Moore, PhD LP Clinical Psychologist, [facility] [address], [place], [place] [address] [phone]

## 2022-10-07 ENCOUNTER — Ambulatory Visit (INDEPENDENT_AMBULATORY_CARE_PROVIDER_SITE_OTHER): Payer: Commercial Managed Care - HMO | Admitting: Psychiatry

## 2022-10-07 DIAGNOSIS — Z87898 Personal history of other specified conditions: Secondary | ICD-10-CM

## 2022-10-07 DIAGNOSIS — F902 Attention-deficit hyperactivity disorder, combined type: Secondary | ICD-10-CM

## 2022-10-07 DIAGNOSIS — F4001 Agoraphobia with panic disorder: Secondary | ICD-10-CM | POA: Diagnosis not present

## 2022-10-07 DIAGNOSIS — F4010 Social phobia, unspecified: Secondary | ICD-10-CM

## 2022-10-07 DIAGNOSIS — Z569 Unspecified problems related to employment: Secondary | ICD-10-CM

## 2022-10-07 DIAGNOSIS — F341 Dysthymic disorder: Secondary | ICD-10-CM | POA: Diagnosis not present

## 2022-10-07 NOTE — Progress Notes (Signed)
Psychotherapy Progress Note [facility] Luan Moore, PhD LP  Patient ID: Chad Burke [facility])    MRN: [id number] Therapy format: Individual psychotherapy Date: 10/07/2022      Start: 3:10p     Stop: 3:55p     Time Spent: 45 min Location: In-person   Session narrative (presenting needs, interim history, self-report of stressors and symptoms, applications of prior therapy, status changes, and interventions made in session) Father's guidance to mother working out very well not to load him up with worry, she's been great ever since.  Talked with Chad Burke as well, able to shed light on how her asking about full-time work spiked anxiety, too.  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  MWF schedule, which allows for Tu/[employer] carpet and insurance with father.  A lot to learn, and the woman who was training him was moving way too fast to concentrate, but he has asserted himself sowing it down to learn, and not bailed out.  Yesterday was 4th day, feels like 4 weeks for the amount of info to learn.  Full days have been daunting, but helpful to alternate jobs.  Getting help sorting tasks into crucial and less important.  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  To his credit, already able to state he has anxiety and ask for slower instruction, is taking notes, and intends to stick it out until regular agent comes back.  Some worry he may fail expectations, but so far, so good.  Addressed apprehension about dealing with anxiety on the job and the urge to run -- framed 5 "I cans": Take a walk, breathe, get a coffee Ask a minute to collect myself Ask for help/directions with the work Permission to "limp" right now Decide my ultimate work future later Also addressed  possibility of a metabolic issue, briefly educating on insulin resistance and carb control.  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    Notes that he took step to change his gym membership. Combined with all the other moves he's making, great credit to [name] liberating his mind to motivate, calm agitation, concentrate, and have better resolve.  Therapeutic modalities: Cognitive Behavioral Therapy, Solution-Oriented/Positive Psychology, and Ego-Supportive  Mental Status/Observations:  Appearance:   Casual     Behavior:  Appropriate  Motor:  Normal  Speech/Language:   Clear and Coherent  Affect:  Appropriate  Mood:  anxious and euthymic  Thought process:  normal  Thought content:    WNL  Sensory/Perceptual disturbances:    WNL  Orientation:  Fully oriented  Attention:  Good    Concentration:  Good  Memory:  WNL  Insight:    Good  Judgment:   Good  Impulse Control:  Good   Risk Assessment: Danger to Self: No Self-injurious Behavior: No Danger to Others: No Physical Aggression / Violence: No Duty to Warn: No Access to Firearms a concern: No  Assessment of progress:  progressing  Diagnosis:   ICD-10-CM   1. Panic disorder with agoraphobia  F40.01     2. Social anxiety disorder  F40.10     3. Dysthymic disorder - controlled  F34.1     4. Attention deficit hyperactivity disorder (ADHD), combined type  F90.2     5. History of suspected LD and known substance use, long sober  Z87.898     6. Occupational problem  Z56.9      Plan:  Meds/nutrition Endorse Auvelity Advocate vitamin D Recommend Super B complex for nutritional balance, stress effects, and alertness Most likely recommend omega 3 for suspected cholesterol issue and general  antiinflammation MCT (and/or partial keto dieting) option to break up carb sensitivity Sleep melatonin timing about 2 hours before bedtime, as tolerated; may use amber glasses to help set internal clock continue other measures as needed and consult on sleep hygiene as needed. 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  If supply of insurance work is too limited, option to see about loaning out to other [employer] reps if they could use a deputy/partner to work accounts. Put down worry over doing more anxiety-challenging aspects of insurance work, just build on what's comfortable enough, repeat till no need of Klonopin and expand tasks Diligence -- Aim for "a little something" done, showing up some amount.   Voc Rehab if can get the process working Offer remains to look over job listings together for practice tolerating distress and for closer assessment of work-seeking issues and potential matches for work. Anxiety/worry exposure Continue generally, for all social and performance anxiety exposures, continue to endorse an attitude of going in anyway and capitalizing on opportunities to break the cycle of obsessing and catastrophizing ahead of time Practice diaphragmatic breathing and paced breathing techniques for anxiety control, for eventual use as a "fire extinguisher" for kindling panic Pay attention to conclusion-jumping thoughts and try to call "time out".   Resolve to let things be possible, even if they are hard, or anxiety-provoking.  Admit not knowing what "will" go wrong. On the job, use the 5 "I cans" above Self-affirm progress and confidences fulfilled Emotional expression and support  Take wife up on the offer to hear out how he  feels, what he's struggling with, accept the offer PRN let family know if well-intentioned support raises anxiety.  Option to include in therapy PRN. Other recommendations/advice as may be noted above Continue to utilize previously learned skills ad lib Maintain medication as prescribed and work faithfully with relevant prescriber(s) if any changes are desired or seem indicated Call the clinic on-call service, 988/hotline, 911, or present to [facility] or ER if any life-threatening psychiatric crisis Return for time as already scheduled. Already scheduled visit in this office 10/22/2022.  [name], PhD Luan Moore, PhD LP Clinical Psychologist, [facility] [address], [place], [place] [address] [phone]

## 2022-10-21 ENCOUNTER — Ambulatory Visit: Payer: Commercial Managed Care - HMO | Admitting: Psychiatry

## 2022-10-22 ENCOUNTER — Ambulatory Visit: Payer: Commercial Managed Care - HMO | Admitting: Psychiatry

## 2022-11-05 ENCOUNTER — Ambulatory Visit: Payer: Commercial Managed Care - HMO | Admitting: Psychiatry

## 2022-11-19 ENCOUNTER — Ambulatory Visit: Payer: Commercial Managed Care - HMO | Admitting: Psychiatry

## 2022-12-03 ENCOUNTER — Ambulatory Visit: Payer: Commercial Managed Care - HMO | Admitting: Psychiatry

## 2022-12-21 ENCOUNTER — Ambulatory Visit (INDEPENDENT_AMBULATORY_CARE_PROVIDER_SITE_OTHER): Payer: 59 | Admitting: Psychiatry

## 2022-12-21 DIAGNOSIS — F4001 Agoraphobia with panic disorder: Secondary | ICD-10-CM

## 2022-12-21 DIAGNOSIS — Z569 Unspecified problems related to employment: Secondary | ICD-10-CM

## 2022-12-21 DIAGNOSIS — F902 Attention-deficit hyperactivity disorder, combined type: Secondary | ICD-10-CM

## 2022-12-21 DIAGNOSIS — F4010 Social phobia, unspecified: Secondary | ICD-10-CM | POA: Diagnosis not present

## 2022-12-21 DIAGNOSIS — F341 Dysthymic disorder: Secondary | ICD-10-CM

## 2022-12-21 DIAGNOSIS — Z87898 Personal history of other specified conditions: Secondary | ICD-10-CM

## 2022-12-21 NOTE — Progress Notes (Signed)
Psychotherapy Progress Note [facility] Luan Moore, PhD LP  Patient ID: OLAND MALANGA [facility])    MRN: [id number] Therapy format: Individual psychotherapy Date: 12/21/2022      Start: 2:09p     Stop: 2:58p     Time Spent: 49 min Location: In-person   Session narrative (presenting needs, interim history, self-report of stressors and symptoms, applications of prior therapy, status changes, and interventions made in session) Back after 10 weeks.  Job continues, made it through after the first wild week of orientation and training, which was shortened by his mentor going into labor early.  She came back yesterday, so now he is reassigned to learn various other positions.  He has worked through Arboriculturist some Film/video editor, still leery of learning new tasks, which always brings up anxiety and self-doubt.  Encouraged in paying attention, asking for clarification and slowed teaching where needed, and trusting he will get things as fast as actually needed, just stay honest and prompt about difficulties.  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  Knows he needs to reestablish regular exercise.  Helped today to get to the gym, on a day off.  Probed options to get some movement into his day and encouraged good warming and limbering given his back and hip.  Financially, some help that [name] starts back early March with [employer] a raise, though it's still only a seasonal job.  Notes she has gotten more intense lately about him being preoccupied in his phone, or in his head worrying.  Discussed   D Jarrett Soho is decidedly liking preschool, though she has some bad days when her speech comes difficult.    Medication still working as intended.  Morning methylphenidate not working as well as hoped, but  probably a matter of little lag time before work.  IR formula, but for some reason it doesn't kick in the same in the morning as afternoon.  No sleep deficit suspected.  Interpreted as physiological -- need of water, light, movement, and/or a little cold temp to wake up physiologically, encouraged in seeking stimulus that way.    For mindset, noticing the old doubts come up about feeling stuck.    Therapeutic modalities: Cognitive Behavioral Therapy, Solution-Oriented/Positive Psychology, and Ego-Supportive  Mental Status/Observations:  Appearance:   Casual     Behavior:  Appropriate  Motor:  Normal  Speech/Language:   Clear and Coherent  Affect:  Appropriate  Mood:  anxious and euthymic  Thought process:  normal  Thought content:    WNL  Sensory/Perceptual disturbances:    WNL  Orientation:  Fully oriented  Attention:  Good    Concentration:  Fair  Memory:  WNL  Insight:    Good  Judgment:   Good  Impulse Control:  Good   Risk Assessment: Danger to Self: No Self-injurious Behavior: No Danger to Others: No Physical Aggression / Violence: No Duty to Warn: No Access to Firearms a concern: No  Assessment of progress:  progressing  Diagnosis:   ICD-10-CM   1. Panic disorder with agoraphobia  F40.01     2. Social anxiety disorder  F40.10     3. Dysthymic disorder - controlled  F34.1     4. Attention deficit hyperactivity disorder (ADHD), combined type  F90.2     5.  History of suspected LD and known substance use, long sober  Z87.898     6. Occupational problem  Z56.9      Plan:  Current: Better waking -- Use light, water, movement, and/or chill.  Option to query psychiatry about an overnight late-release stimulant. Exercise -- if not gym, find short exercise "snacks", and limber up well Marital -- practice actually sharing what's on his mind with wife, and give her clearance to not settle for "fine" or "nothing" Best to limit phone surfing unless/until it's intentional  recreation -- balance of sharing and individual recreation, as long as it's not trying to avoid thoughts and feelings Meds/nutrition Endorse Auvelity Advocate vitamin D Recommend Super B complex for nutritional balance, stress effects, and alertness Most likely recommend omega 3 for suspected cholesterol issue and general antiinflammation MCT (and/or partial keto dieting) option to break up carb sensitivity Sleep melatonin timing about 2 hours before bedtime, as tolerated; may use amber glasses to help set internal clock continue other measures as needed and consult on sleep hygiene as needed. 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  If supply of insurance work is too limited, option to see about loaning out to other [employer] reps if they could use a deputy/partner to work accounts. Put down worry over doing more anxiety-challenging aspects of insurance work, just build on what's comfortable enough, repeat till no need of Klonopin and expand tasks Diligence -- Aim for "a little something" done, showing up some amount.   Voc Rehab if can get the process working Offer remains to look over job listings together for practice tolerating distress and for closer assessment of work-seeking issues and potential matches for work. Anxiety/worry exposure Continue generally, for all social and performance anxiety exposures, continue to endorse an attitude of going in anyway and capitalizing on opportunities to break the cycle of obsessing and catastrophizing ahead of time Practice diaphragmatic breathing and paced breathing techniques for anxiety control, for eventual use as a "fire extinguisher" for kindling panic Pay attention to conclusion-jumping  thoughts and try to call "time out".   Resolve to let things be possible, even if they are hard, or anxiety-provoking.  Admit not knowing what "will" go wrong. On the job, use the 5 "I cans" above Self-affirm progress and confidences fulfilled Emotional expression and support  Take wife up on the offer to hear out how he feels, what he's struggling with, accept the offer PRN let family know if well-intentioned support raises anxiety.  Option to include in therapy PRN. Other recommendations/advice as may be noted above Continue to utilize previously learned skills ad lib Maintain medication as prescribed and work faithfully with relevant prescriber(s) if any changes are desired or seem indicated Call the clinic on-call service, 988/hotline, 911, or present to [facility] or ER if any life-threatening psychiatric crisis Return for as already scheduled, time as already scheduled. Already scheduled visit in this office 02/03/2023.  Addendum: 3/7 [place] by provider due to family medical need, also 3/28.  Scheduled about q 3 wks through May.  [name], PhD Luan Moore, PhD LP Clinical Psychologist, [facility] [address] [place], [place] [address] [phone]

## 2023-01-13 ENCOUNTER — Ambulatory Visit: Payer: Self-pay | Admitting: Psychiatry

## 2023-02-01 ENCOUNTER — Ambulatory Visit (INDEPENDENT_AMBULATORY_CARE_PROVIDER_SITE_OTHER): Payer: 59

## 2023-02-01 ENCOUNTER — Ambulatory Visit
Admission: EM | Admit: 2023-02-01 | Discharge: 2023-02-01 | Disposition: A | Discharge status: Home / Self Care | Payer: 59

## 2023-02-01 DIAGNOSIS — M542 Cervicalgia: Secondary | ICD-10-CM

## 2023-02-01 DIAGNOSIS — M5412 Radiculopathy, cervical region: Secondary | ICD-10-CM | POA: Diagnosis not present

## 2023-02-01 MED ORDER — DEXAMETHASONE SODIUM PHOSPHATE 10 MG/ML IJ SOLN
10.0000 mg | Freq: Once | INTRAMUSCULAR | Status: AC
  Administered 2023-02-01: 10 mg via INTRAMUSCULAR

## 2023-02-01 NOTE — ED Triage Notes (Signed)
Pt reports pain on and off neck pain, tingling, stiffness in shoulder since December 2024.

## 2023-02-01 NOTE — ED Provider Notes (Signed)
[facility]  Note:  This document was prepared using Systems analyst and may include unintentional dictation errors.  MRN: [id number] DOB: 18-Dec-1980  Subjective:   Chad Burke is a 42 y.o. male presenting for 57-month history of persistent progressive pain at the base of his neck with associated tingling, numbness type sensations that radiate toward the trapezius and shoulder.  Symptoms initially started towards the left and now are on the right.  Has had some stiffness.  No fall, trauma, history of musculoskeletal disorders, arthritis.  Patient believes that it may have been work-related but he changed jobs and is doing work tasks and the problem persist.  Has had difficulty tolerating prednisone in the past.  No current facility-administered medications for this encounter.  Current Outpatient Medications:    AUVELITY 45-105 MG TBCR, Take by mouth., Disp: , Rfl:    carbamide peroxide (DEBROX) 6.5 % OTIC solution, Place 5 drops into both ears daily., Disp: 15 mL, Rfl: 0   cetirizine (ZYRTEC ALLERGY) 10 MG tablet, Take 1 tablet (10 mg total) by mouth 2 (two) times daily., Disp: 60 tablet, Rfl: 0   ciprofloxacin-dexamethasone (CIPRODEX) OTIC suspension, Place 4 drops into both ears 2 (two) times daily. X 7 days, Disp: 7.5 mL, Rfl: 0   clonazePAM (KLONOPIN) 1 MG tablet, Take 1 mg by mouth 2 (two) times daily., Disp: , Rfl:    FLUoxetine (PROZAC) 20 MG capsule, Take 60 mg by mouth every morning., Disp: , Rfl:    methylphenidate (RITALIN) 10 MG tablet, Take 10 mg by mouth 2 (two) times daily., Disp: , Rfl:    triamcinolone cream (KENALOG) 0.5 %, Apply 1 Application topically 2 (two) times daily. Apply to affected area(s) twice daily , do not apply to face., Disp: 30 g, Rfl: 1   Allergies  Allergen Reactions   Codeine     GI upset   Prednisone Other (See Comments)    Patient states it interacts with his depression medication, does not tolerate  prednisone for this reason    Past Medical History:  Diagnosis Date   Anxiety    Sleep difficulties      History reviewed. No pertinent surgical history.  Family History  Problem Relation Age of Onset   Diabetes Mother    Hypertension Mother    Anxiety disorder Mother    Hypertension Father    Hypothyroidism Sister     Social History   Tobacco Use   Smoking status: Former    Packs/day: 1.00    Years: 12.00    Additional pack years: 0.00    Total pack years: 12.00    Types: Cigarettes    Quit date: 11/08/2005    Years since quitting: 17.2   Smokeless tobacco: Never  Substance Use Topics   Alcohol use: Yes    Comment: social   Drug use: No    ROS   Objective:   Vitals: BP 129/75 (BP Location: Right Arm)   Pulse 66   Temp 98.6 F (37 C) (Oral)   Resp 16   SpO2 97%   Physical Exam Constitutional:      General: He is not in acute distress.    Appearance: Normal appearance. He is well-developed and normal weight. He is not ill-appearing, toxic-appearing or diaphoretic.  HENT:     Head: Normocephalic and atraumatic.     Right Ear: External ear normal.     Left Ear: External ear normal.     Nose:  Nose normal.     Mouth/Throat:     Pharynx: Oropharynx is clear.  Eyes:     General: No scleral icterus.       Right eye: No discharge.        Left eye: No discharge.     Extraocular Movements: Extraocular movements intact.  Cardiovascular:     Rate and Rhythm: Normal rate.  Pulmonary:     Effort: Pulmonary effort is normal.  Musculoskeletal:     Cervical back: Normal range of motion. Spasms and tenderness (mild at the base of the neck, also has tenderness over the paraspinal muscles of the cervical region worse toward the right extending into the trapezius, +Spurling maneuver) present. No swelling, edema, deformity, erythema, signs of trauma, lacerations, rigidity, torticollis, bony tenderness or crepitus. Pain with movement present. Normal range of motion.      Comments: Negative Lhermitte sign.  Lymphadenopathy:     Cervical: No cervical adenopathy.  Neurological:     Mental Status: He is alert and oriented to person, place, and time.  Psychiatric:        Mood and Affect: Mood normal.        Behavior: Behavior normal.        Thought Content: Thought content normal.        Judgment: Judgment normal.    DG Cervical Spine Complete  Result Date: 02/01/2023 CLINICAL DATA:  Neck pain EXAM: CERVICAL SPINE - COMPLETE 4+ VIEW COMPARISON:  None Available. FINDINGS: There is no evidence of cervical spine fracture or prevertebral soft tissue swelling. Alignment is normal. No other significant bone abnormalities are identified. IMPRESSION: Negative cervical spine radiographs. Electronically Signed   By: Merilyn Baba M.D.   On: 02/01/2023 17:10    IM dexamethasone administered in clinic 10mg .   Assessment and Plan :   PDMP not reviewed this encounter.  1. Cervical radiculopathy   2. Neck pain     After careful consideration of the treatment options, patient and I agreed to use IM dexamethasone as opposed to oral steroids and definitely not prednisone.  Follow-up with the spine specialty clinic as soon as possible.  Patient would benefit from further workup including MRI. Counseled patient on potential for adverse effects with medications prescribed/recommended today, ER and return-to-clinic precautions discussed, patient verbalized understanding.    Jaynee Eagles, PA-C 02/01/23 1725

## 2023-02-01 NOTE — Discharge Instructions (Addendum)
Please make sure you follow-up with [facility].  They can provide a consultation for you regarding more treatment such as injections and physical therapy.  They can also pursue an MRI if it comes to that.

## 2023-02-03 ENCOUNTER — Ambulatory Visit: Payer: Self-pay | Admitting: Psychiatry

## 2023-02-07 DIAGNOSIS — M542 Cervicalgia: Secondary | ICD-10-CM | POA: Diagnosis not present

## 2023-02-14 DIAGNOSIS — F330 Major depressive disorder, recurrent, mild: Secondary | ICD-10-CM | POA: Diagnosis not present

## 2023-02-24 ENCOUNTER — Ambulatory Visit (INDEPENDENT_AMBULATORY_CARE_PROVIDER_SITE_OTHER): Payer: 59 | Admitting: Psychiatry

## 2023-02-24 DIAGNOSIS — F4010 Social phobia, unspecified: Secondary | ICD-10-CM | POA: Diagnosis not present

## 2023-02-24 DIAGNOSIS — Z87898 Personal history of other specified conditions: Secondary | ICD-10-CM | POA: Diagnosis not present

## 2023-02-24 DIAGNOSIS — F341 Dysthymic disorder: Secondary | ICD-10-CM

## 2023-02-24 DIAGNOSIS — Z79899 Other long term (current) drug therapy: Secondary | ICD-10-CM | POA: Diagnosis not present

## 2023-02-24 DIAGNOSIS — F902 Attention-deficit hyperactivity disorder, combined type: Secondary | ICD-10-CM | POA: Diagnosis not present

## 2023-02-24 DIAGNOSIS — Z8639 Personal history of other endocrine, nutritional and metabolic disease: Secondary | ICD-10-CM

## 2023-02-24 DIAGNOSIS — F4001 Agoraphobia with panic disorder: Secondary | ICD-10-CM

## 2023-02-24 NOTE — Progress Notes (Signed)
Psychotherapy Progress Note [facility] Marliss Czar, PhD LP  Patient ID: Chad Burke [facility])    MRN: [id number] Therapy format: Individual psychotherapy Date: 02/24/2023      Start: 4:14p     Stop: 5:00p     Time Spent: 46 min Location: In-person   Session narrative (presenting needs, interim history, self-report of stressors and symptoms, applications of prior therapy, status changes, and interventions made in session) Dad's been healing his kneecap, Desten still working Engineer, civil (consulting) jobs alone until recently.  Mom needed bowel blockage surgery.  Dad had a syncope episode in a store, attributed to BP med working too well. 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  Saw psychiatry last week, good report on meds, including Auvelity.  Has begun weaning clonazepam now -- 1.5 instead of 2 at night, with expectation of a gentle taper.  Hx of taking as much as 3 or 4mg  QHS, but psychiatry kind of thought that hx is not enough for him to have as brittle a reaction as he is having.  Still makes plenty of sense that he has been strongly conditioned, at least, to notice both being less medicated and being on a project to turn loose of his sedative.  Agreed he can easily taper more gently, say 1/4 pill per 2-4 wks if actually needed, but continue to press into the taper and reframe for himself that whatever discomfort he feels is what it feels like to be making more emotional and cognitive freedom.  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  Doing well enough now to begin looking at property.  Unfortunately, she was also dx'd with Sjogren's, which is intimidating given her mother's notable lupus.  Support/empathy provided.  Affirmed and encouraged in all of the above.  Therapeutic modalities: Cognitive Behavioral Therapy, Solution-Oriented/Positive Psychology, and Ego-Supportive  Mental  Status/Observations:  Appearance:   Casual     Behavior:  Appropriate  Motor:  Normal  Speech/Language:   Clear and Coherent  Affect:  Appropriate  Mood:  Stable, manageable anxiety  Thought process:  normal  Thought content:    WNL  Sensory/Perceptual disturbances:    WNL  Orientation:  Fully oriented  Attention:  Good    Concentration:  Good  Memory:  WNL  Insight:    Good  Judgment:   Good  Impulse Control:  Good   Risk Assessment: Danger to Self: No Self-injurious Behavior: No Danger to Others: No Physical Aggression / Violence: No Duty to Warn: No Access to Firearms a concern: No  Assessment of progress:  progressing well  Diagnosis:   ICD-10-CM   1. Panic disorder with agoraphobia - stable  F40.01     2. Social anxiety disorder - stable  F40.10     3. Long-term current use of benzodiazepine - weaning  Z79.899     4. Dysthymic disorder - controlled  F34.1     5. Attention deficit hyperactivity disorder (ADHD), combined type  F90.2     6. History of suspected LD and known substance use, long sober  Z87.898     7. History of vitamin D deficiency  Z86.39      Plan:  Current: Exercise -- if not gym, find short exercise "snacks", and limber up well Marital -- practice actually sharing what's on his mind with wife, and give her clearance to not settle for "fine" or "nothing" Best to limit phone surfing unless/until it's intentional recreation --  balance of sharing and individual recreation, as long as it's not trying to avoid thoughts and feelings Meds/nutrition Endorse Auvelity Advocate vitamin D Recommend Super B complex for nutritional balance, stress effects, and alertness Most likely recommend omega 3 for suspected cholesterol issue and general antiinflammation MCT (and/or partial keto dieting) option to break up carb sensitivity Sleep melatonin timing about 2 hours before bedtime, as tolerated; may use amber glasses to help set internal clock PRN waking  techniques -- light, water, chill, brisk awakening; option to ask psychiatry about an overnight-release stimulant continue other measures PRN, consult on sleep hygiene PRN Occupational/financial 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  Option to train further on panic-control techniques.  Cognitive -- Pay attention to conclusion-jumping thoughts and try to call "time out".  Resolve to let things be possible, even if they are hard, or anxiety-provoking.  Admit not knowing what "will" go wrong.  On the job, use the 5 "I can" statements above.  Self-affirm progress and successes, confidences fulfilled. Emotional expression and support  Take wife up on the offer to hear out how he feels, what he's struggling with PRN let family know if well-intentioned support backfires or raises anxiety instead.  Option to include in therapy PRN. Medication -- Endorse current strategies of Auvelity and weaning clonazepam.  Address pace of weaning with psychiatry PRN.   Other recommendations/advice as may be noted above Continue to utilize previously learned skills ad lib Maintain medication as prescribed and work faithfully with relevant prescriber(s) if any changes are desired or seem indicated Call the clinic on-call service, 988/hotline, 911, or present to [facility] or ER if any  life-threatening psychiatric crisis Return for time as already scheduled. Already scheduled visit in this office 03/17/2023.  Robley Fries, PhD Marliss Czar, PhD LP Clinical Psychologist, [facility] [address] [place], [place] [address] [phone]

## 2023-03-01 ENCOUNTER — Ambulatory Visit: Payer: 59 | Attending: Neurosurgery

## 2023-03-01 DIAGNOSIS — R293 Abnormal posture: Secondary | ICD-10-CM | POA: Diagnosis not present

## 2023-03-01 DIAGNOSIS — M542 Cervicalgia: Secondary | ICD-10-CM | POA: Diagnosis not present

## 2023-03-01 DIAGNOSIS — M5412 Radiculopathy, cervical region: Secondary | ICD-10-CM | POA: Insufficient documentation

## 2023-03-01 NOTE — Therapy (Signed)
OUTPATIENT PHYSICAL THERAPY CERVICAL EVALUATION   Patient Name: Chad Burke MRN: [id number] DOB:13-Jan-1981, 42 y.o., male Today's Date: 03/01/2023  END OF SESSION:  PT End of Session - 03/01/23 1544     Visit Number 1    Date for PT Re-Evaluation 04/19/23    Authorization Type [employer]    PT Start Time 1545    PT Stop Time 1630    PT Time Calculation (min) 45 min    Activity Tolerance Patient tolerated treatment well    Behavior During Therapy WFL for tasks assessed/performed             Past Medical History:  Diagnosis Date   Anxiety    Sleep difficulties    History reviewed. No pertinent surgical history. Patient Active Problem List   Diagnosis Date Noted   Chronic insomnia 06/22/2011    PCP: Gildardo Cranker  REFERRING PROVIDER: Hoyt Koch  REFERRING DIAG:  M54.2 (ICD-10-CM) - Cervicalgia      THERAPY DIAG:  Cervicalgia  Radiculopathy, cervical region  Abnormal posture  Rationale for Evaluation and Treatment: Rehabilitation  ONSET DATE: 02/01/23  SUBJECTIVE:                                                                                                                                                                                                         SUBJECTIVE STATEMENT: Doing better than I was, but still having some of the same issues. 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 I have some numbness and tingling in the back of my head, it used to go down my shoulder but now it is more centered.   Hand dominance: Left  PERTINENT HISTORY:  Chad Burke is a 42 y.o. male presenting for 50-month history of persistent progressive pain at the base of his neck with associated tingling, numbness type sensations that radiate toward the trapezius and shoulder.  Symptoms initially started towards the left and now are on the right.  Has had  some stiffness.  No fall, trauma, history of musculoskeletal disorders, arthritis.  Patient believes that it may have been work-related but he changed jobs and is doing work tasks and the problem persist.  Has had difficulty tolerating prednisone in the past.  PAIN:  Are you having pain? Yes: NPRS scale: 5/10 Pain location: midline c-spine and to the right Pain description: ache, numbness/tingling Aggravating factors: turning my head or side bending to the  right Relieving factors: ibuprofen  PRECAUTIONS: None  WEIGHT BEARING RESTRICTIONS: No  FALLS:  Has patient fallen in last 6 months? 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 Alignment is normal. No other significant bone abnormalities are identified.   IMPRESSION: Negative cervical spine radiographs.   COGNITION: Overall cognitive status: Within functional limits for tasks assessed  SENSATION: WFL  POSTURE: rounded shoulders  PALPATION: No significant findings with palpation   CERVICAL ROM:   Active ROM A/PROM (deg) eval  Flexion WFL  Extension WFL  Right lateral flexion 50%  Left lateral flexion 50%  Right rotation 75%  Left rotation WFL   (Blank rows = not tested)  UPPER EXTREMITY ROM: grossly WFL   UPPER EXTREMITY MMT: grossly WFL    CERVICAL SPECIAL TESTS:  Spurling's test: Negative and Distraction test: Negative   TODAY'S TREATMENT:                                                                                                                              DATE: 03/01/23- EVAL   PATIENT EDUCATION:  Education details: POC and HEP Person educated: Patient Education method:  Explanation Education comprehension: verbalized understanding  HOME EXERCISE PROGRAM: [id number]  ASSESSMENT:  CLINICAL IMPRESSION: Patient is a 42 y.o. male who was seen today for physical therapy evaluation and treatment for neck pain. He states that a few months ago he would get this crawling/itching sensation in the back of his neck that came and went. He then began having some numbness and tingling in his neck that sometimes radiates into his shoulder but this has improved in the last few weeks. He has some tightness and pain with right rotation and right lateral flexion. His symptoms do not radiate into his arm or fingers. He had a change in jobs where he was sitting at a desk but now works in a different department as he felt the desk job was worsening his problems. The root of his symptoms is unknown but was educated on trying some stretching and postural strengthening to see if maybe it will help alleviate his pain and numbness and tingling. Will benefit from PT to address his concerns.   OBJECTIVE IMPAIRMENTS: pain.   REHAB POTENTIAL: Good  CLINICAL DECISION MAKING: Evolving/moderate complexity  EVALUATION COMPLEXITY: Low   GOALS: Goals reviewed with patient? Yes  SHORT TERM GOALS: Target date: 03/29/23  Patient will be independent with initial HEP.  Goal status: INITIAL   LONG TERM GOALS: Target date: 04/19/23  Patient will be independent with advanced/ongoing HEP to improve outcomes and carryover.  Goal status: INITIAL  2.  Patient will report 75% improvement  in neck pain to improve QOL.  Baseline: 5/10 when it hurts Goal status: INITIAL  3.  Patient will demonstrate full pain free cervical ROM for safety with driving.  Baseline: see chart above Goal status: INITIAL  4.  Patient will report no numbness and tingling in neck  Baseline: N/T at C7 Goal status: INITIAL    PLAN:  PT FREQUENCY: 1x/week  PT DURATION: 6 weeks  PLANNED INTERVENTIONS: Therapeutic  exercises, Therapeutic activity, Neuromuscular re-education, Balance training, Gait training, Patient/Family education, Self Care, Joint mobilization, Dry Needling, Electrical stimulation, Spinal mobilization, Cryotherapy, Moist heat, Traction, Ionotophoresis /ml Dexamethasone, and Manual therapy  PLAN FOR NEXT SESSION: manual therapy to neck, postural strengthening, maybe try traction    Cassie Freer, PT 03/01/2023, 4:27 PM

## 2023-03-10 ENCOUNTER — Ambulatory Visit: Payer: 59 | Attending: Neurosurgery | Admitting: Physical Therapy

## 2023-03-10 ENCOUNTER — Encounter: Payer: Self-pay | Admitting: Physical Therapy

## 2023-03-10 DIAGNOSIS — R293 Abnormal posture: Secondary | ICD-10-CM | POA: Diagnosis not present

## 2023-03-10 DIAGNOSIS — M5412 Radiculopathy, cervical region: Secondary | ICD-10-CM | POA: Diagnosis not present

## 2023-03-10 DIAGNOSIS — M542 Cervicalgia: Secondary | ICD-10-CM

## 2023-03-10 NOTE — Therapy (Signed)
OUTPATIENT PHYSICAL THERAPY CERVICAL TREATMENT   Patient Name: Chad Burke MRN: [id number] DOB:[birth year], 42 y.o., male Today's Date: 03/10/2023  END OF SESSION:  PT End of Session - 03/10/23 1429     Visit Number 2    Date for PT Re-Evaluation 04/19/23    PT Start Time 1430    PT Stop Time 1515    PT Time Calculation (min) 45 min    Activity Tolerance Patient tolerated treatment well    Behavior During Therapy [facility] for tasks assessed/performed             Past Medical History:  Diagnosis Date   Anxiety    Sleep difficulties    History reviewed. No pertinent surgical history. Patient Active Problem List   Diagnosis Date Noted   Chronic insomnia 06/22/2011    PCP: Gildardo Cranker  REFERRING PROVIDER: Hoyt Koch  REFERRING DIAG:  M54.2 (ICD-10-CM) - Cervicalgia      THERAPY DIAG:  Radiculopathy, cervical region  Abnormal posture  Cervicalgia  Rationale for Evaluation and Treatment: Rehabilitation  ONSET DATE: 02/01/23  SUBJECTIVE:                                                                                                                                                                                                         SUBJECTIVE STATEMENT: No change since evaluation. Tingling in the base of his neck with HEP   Hand dominance: Left  PERTINENT HISTORY:  Chad Burke is a 42 y.o. male presenting for 88-month history of persistent progressive pain at the base of his neck with associated tingling, numbness type sensations that radiate toward the trapezius and shoulder.  Symptoms initially started towards the left and now are on the right.  Has had some stiffness.  No fall, trauma, history of musculoskeletal disorders, arthritis.  Patient believes that it may have been work-related but he changed jobs and is doing work tasks and the problem persist.  Has had difficulty tolerating prednisone in the past.  PAIN:  Are you having pain? Yes: NPRS  scale: 0/10 Pain location: midline c-spine and to the right Pain description: ache, numbness/tingling Aggravating factors: turning my head or side bending to the right Relieving factors: ibuprofen  PRECAUTIONS: None  WEIGHT BEARING RESTRICTIONS: No  FALLS:  Has patient fallen in last 6 months? 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 Alignment is normal. No other significant bone abnormalities are identified.   IMPRESSION: Negative cervical spine radiographs.   COGNITION: Overall cognitive status: Within functional limits for tasks assessed  SENSATION: WFL  POSTURE: rounded shoulders  PALPATION: No significant findings with palpation   CERVICAL ROM:   Active ROM A/PROM (deg) eval  Flexion WFL  Extension WFL  Right lateral flexion 50%  Left lateral flexion 50%  Right rotation 75%  Left rotation WFL   (Blank rows = not tested)  UPPER EXTREMITY ROM: grossly WFL   UPPER EXTREMITY MMT: grossly WFL    CERVICAL SPECIAL TESTS:  Spurling's test: Negative and Distraction test: Negative   TODAY'S TREATMENT:                                                                                                                              DATE:  03/09/24 UBE L2 x 3 min each Seated Rows & Lats 35lb 2x10 Cervical retractions yellow 2x10 Shoulder Er green 2x10  Shoulder Ext 10lb 2x10  STM to cervical para spinales PROM to cervical spine with end range holds Went over body mechanics while on the job  03/01/23- EVAL   PATIENT EDUCATION:  Education details: POC and HEP Person educated: Patient Education method: Explanation Education comprehension:  verbalized understanding  HOME EXERCISE PROGRAM: [id number]  ASSESSMENT:  CLINICAL IMPRESSION: Patient is a 42 y.o. male who was seen today for physical therapy treatment for neck pain. He has some tightness and pain with right rotation and right lateral flexion.  Session consisted og some postural strengthening and MT. He sis well with rows and lats. Postural cue needed with ER and shoulder extensions. Pt reports numbness to the R of C7 with MT.  Pt Will benefit from PT to address his concerns.   OBJECTIVE IMPAIRMENTS: pain.   REHAB POTENTIAL: Good  CLINICAL DECISION MAKING: Evolving/moderate complexity  EVALUATION COMPLEXITY: Low   GOALS: Goals reviewed with patient? Yes  SHORT TERM GOALS: Target date: 03/29/23  Patient will be independent with initial HEP.  Goal status: INITIAL   LONG TERM GOALS: Target date: 04/19/23  Patient will be independent with advanced/ongoing HEP to improve outcomes and carryover.  Goal status: INITIAL  2.  Patient will report 75% improvement in neck pain to improve QOL.  Baseline: 5/10 when it hurts Goal status: INITIAL  3.  Patient will demonstrate full pain free cervical ROM for safety with driving.  Baseline: see chart above Goal status: INITIAL  4.  Patient will report no numbness and tingling in neck  Baseline: N/T at C7 Goal status: INITIAL    PLAN:  PT FREQUENCY: 1x/week  PT DURATION: 6 weeks  PLANNED INTERVENTIONS: Therapeutic exercises, Therapeutic activity, Neuromuscular re-education, Balance training, Gait training, Patient/Family education, Self Care, Joint mobilization, Dry Needling, Electrical stimulation, Spinal mobilization, Cryotherapy, Moist heat, Traction, Ionotophoresis  4mg /ml Dexamethasone, and Manual therapy  PLAN FOR NEXT SESSION: manual therapy to neck, postural strengthening, maybe try traction    Grayce Sessions, PTA 03/10/2023, 2:30 PM

## 2023-03-17 ENCOUNTER — Ambulatory Visit: Payer: 59 | Admitting: Physical Therapy

## 2023-03-17 ENCOUNTER — Ambulatory Visit: Payer: Self-pay | Admitting: Psychiatry

## 2023-03-17 ENCOUNTER — Encounter: Payer: Self-pay | Admitting: Physical Therapy

## 2023-03-17 DIAGNOSIS — M5412 Radiculopathy, cervical region: Secondary | ICD-10-CM

## 2023-03-17 DIAGNOSIS — M542 Cervicalgia: Secondary | ICD-10-CM

## 2023-03-17 DIAGNOSIS — R293 Abnormal posture: Secondary | ICD-10-CM | POA: Diagnosis not present

## 2023-03-17 NOTE — Therapy (Signed)
OUTPATIENT PHYSICAL THERAPY CERVICAL TREATMENT   Patient Name: Chad Burke MRN: [id number] DOB:1981-06-06, 42 y.o., male Today's Date: 03/17/2023  END OF SESSION:  PT End of Session - 03/17/23 1515     Visit Number 3    Date for PT Re-Evaluation 04/19/23    PT Start Time 1515    PT Stop Time 1600    PT Time Calculation (min) 45 min    Activity Tolerance Patient tolerated treatment well    Behavior During Therapy [facility] for tasks assessed/performed             Past Medical History:  Diagnosis Date   Anxiety    Sleep difficulties    History reviewed. No pertinent surgical history. Patient Active Problem List   Diagnosis Date Noted   Chronic insomnia 06/22/2011    PCP: Chad Burke  REFERRING PROVIDER: Hoyt Burke  REFERRING DIAG:  M54.2 (ICD-10-CM) - Cervicalgia      THERAPY DIAG:  Radiculopathy, cervical region  Cervicalgia  Abnormal posture  Rationale for Evaluation and Treatment: Rehabilitation  ONSET DATE: 02/01/23  SUBJECTIVE:                                                                                                                                                                                                         SUBJECTIVE STATEMENT: Tingling in the base of his neck, sometimes a little "prick" sensation in the area  Hand dominance: Left  PERTINENT HISTORY:  Chad Burke is a 42 y.o. male presenting for 31-month history of persistent progressive pain at the base of his neck with associated tingling, numbness type sensations that radiate toward the trapezius and shoulder.  Symptoms initially started towards the left and now are on the right.  Has had some stiffness.  No fall, trauma, history of musculoskeletal disorders, arthritis.  Patient believes that it may have been work-related but he changed jobs and is doing work tasks and the problem persist.  Has had difficulty tolerating prednisone in the past.  PAIN:  Are you having pain?  Yes: NPRS scale: 0/10 Pain location: midline c-spine and to the right Pain description: ache, numbness/tingling Aggravating factors: turning my head or side bending to the right Relieving factors: ibuprofen  PRECAUTIONS: None  WEIGHT BEARING RESTRICTIONS: No  FALLS:  Has patient fallen in last 6 months? 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 Alignment is normal. No other significant bone abnormalities are identified.   IMPRESSION: Negative cervical spine radiographs.   COGNITION: Overall cognitive status: Within functional limits for tasks assessed  SENSATION: WFL  POSTURE: rounded shoulders  PALPATION: No significant findings with palpation   CERVICAL ROM:   Active ROM A/PROM (deg) eval  Flexion WFL  Extension WFL  Right lateral flexion 50%  Left lateral flexion 50%  Right rotation 75%  Left rotation WFL   (Blank rows = not tested)  UPPER EXTREMITY ROM: grossly WFL   UPPER EXTREMITY MMT: grossly WFL    CERVICAL SPECIAL TESTS:  Spurling's test: Negative and Distraction test: Negative   TODAY'S TREATMENT:                                                                                                                              DATE:  03/17/23 NuStep L 5 x 6 min Cervical retractions yellow 2x10 Horiz Abd green 2x10 STM to UT and cervical para spinales  STM at T1  Cervical traction 13lb x 10 min  03/09/24 UBE L2 x 3 min each Seated Rows & Lats 35lb 2x10 Cervical retractions yellow 2x10 Shoulder Er green 2x10  Shoulder Ext 10lb 2x10  STM to cervical para spinales PROM to cervical spine with end range holds Went over body  mechanics while on the job  03/01/23- EVAL   PATIENT EDUCATION:  Education details: POC and HEP Person educated: Patient Education method: Explanation Education comprehension: verbalized understanding  HOME EXERCISE PROGRAM: [id number]  ASSESSMENT:  CLINICAL IMPRESSION: Patient is a 42 y.o. male who was seen today for physical therapy treatment for neck pain. He has some tightness and pain with right rotation and right lateral flexion.  Session consisted of some postural strengthening and MT.  Pt reports numbness to the R of C7 with MT. Soreness sensation reported with STM to R of C7. Added cervical traction to session without issue. Pt Will benefit from PT to address his concerns.   OBJECTIVE IMPAIRMENTS: pain.   REHAB POTENTIAL: Good  CLINICAL DECISION MAKING: Evolving/moderate complexity  EVALUATION COMPLEXITY: Low   GOALS: Goals reviewed with patient? Yes  SHORT TERM GOALS: Target date: 03/29/23  Patient will be independent with initial HEP.  Goal status: Met 03/17/23   LONG TERM GOALS: Target date: 04/19/23  Patient will be independent with advanced/ongoing HEP to improve outcomes and carryover.  Goal status: INITIAL  2.  Patient will report 75% improvement in neck pain to improve QOL.  Baseline: 5/10 when it hurts Goal status: INITIAL  3.  Patient will demonstrate full pain free cervical ROM for safety with driving.  Baseline: see chart above Goal status: INITIAL  4.  Patient will report no numbness and tingling in neck  Baseline: N/T at C7 Goal status: INITIAL  PLAN:  PT FREQUENCY: 1x/week  PT DURATION: 6 weeks  PLANNED INTERVENTIONS: Therapeutic exercises, Therapeutic activity, Neuromuscular re-education, Balance training, Gait training, Patient/Family education, Self Care, Joint mobilization, Dry Needling, Electrical stimulation, Spinal mobilization, Cryotherapy, Moist heat, Traction, Ionotophoresis 4mg /ml Dexamethasone, and Manual therapy  PLAN FOR  NEXT SESSION: manual therapy to neck, postural strengthening, assess traction    Chad Burke, PTA 03/17/2023, 3:15 PM

## 2023-03-23 NOTE — Therapy (Signed)
OUTPATIENT PHYSICAL THERAPY CERVICAL TREATMENT   Patient Name: Chad Burke MRN: [id number] DOB:May 04, 1981, 42 y.o., male Today's Date: 03/24/2023  END OF SESSION:  PT End of Session - 03/24/23 1541     Visit Number 4    Date for PT Re-Evaluation 04/19/23    PT Start Time 1540    PT Stop Time 1625    PT Time Calculation (min) 45 min    Activity Tolerance Patient tolerated treatment well    Behavior During Therapy [facility] for tasks assessed/performed              Past Medical History:  Diagnosis Date   Anxiety    Sleep difficulties    No past surgical history on file. Patient Active Problem List   Diagnosis Date Noted   Chronic insomnia 06/22/2011    PCP: Gildardo Cranker  REFERRING PROVIDER: Hoyt Koch  REFERRING DIAG:  M54.2 (ICD-10-CM) - Cervicalgia      THERAPY DIAG:  Radiculopathy, cervical region  Cervicalgia  Abnormal posture  Rationale for Evaluation and Treatment: Rehabilitation  ONSET DATE: 02/01/23  SUBJECTIVE:                                                                                                                                                                                                         SUBJECTIVE STATEMENT: While I was in the traction, after I left it was non stop tingling for about an hour. Then it let up for a while but then I went back to work and it returned on and off again.   Hand dominance: Left  PERTINENT HISTORY:  Chad Burke is a 42 y.o. male presenting for 16-month history of persistent progressive pain at the base of his neck with associated tingling, numbness type sensations that radiate toward the trapezius and shoulder.  Symptoms initially started towards the left and now are on the right.  Has had some stiffness.  No fall, trauma, history of musculoskeletal disorders, arthritis.  Patient believes that it may have been work-related but he changed jobs and is doing work tasks and the problem persist.  Has  had difficulty tolerating prednisone in the past.  PAIN:  Are you having pain? Yes: NPRS scale: 0/10 Pain location: midline c-spine and to the right Pain description: ache, numbness/tingling Aggravating factors: turning my head or side bending to the right Relieving factors: ibuprofen  PRECAUTIONS: None  WEIGHT BEARING RESTRICTIONS: No  FALLS:  Has patient fallen in last 6 months? 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 Alignment is normal. No other significant bone abnormalities are identified.   IMPRESSION: Negative cervical spine radiographs.   COGNITION: Overall cognitive status: Within functional limits for tasks assessed  SENSATION: WFL  POSTURE: rounded shoulders  PALPATION: No significant findings with palpation   CERVICAL ROM:   Active ROM A/PROM (deg) eval  Flexion WFL  Extension WFL  Right lateral flexion 50%  Left lateral flexion 50%  Right rotation 75%  Left rotation WFL   (Blank rows = not tested)  UPPER EXTREMITY ROM: grossly WFL   UPPER EXTREMITY MMT: grossly WFL    CERVICAL SPECIAL TESTS:  Spurling's test: Negative and Distraction test: Negative   TODAY'S TREATMENT:                                                                                                                              DATE:  03/24/23 UBE L3 x25mins each way Shoulder ext 10# 2x10 Rows and Lats 35# 2x10  Scap retractions green 2x10  3# shoulder flexion 2x10  3# shoulder ext behind back 2x10 Cervical retractions red 2x10 STM and passive stretching to UT and LS 30s holds   03/17/23 NuStep L 5 x 6 min Cervical retractions yellow 2x10 Horiz  Abd green 2x10 STM to UT and cervical para spinales  STM at T1  Cervical traction 13lb x 10 min  03/09/24 UBE L2 x 3 min each Seated Rows & Lats 35lb 2x10 Cervical retractions yellow 2x10 Shoulder Er green 2x10  Shoulder Ext 10lb 2x10  STM to cervical para spinales PROM to cervical spine with end range holds Went over body mechanics while on the job  03/01/23- EVAL   PATIENT EDUCATION:  Education details: POC and HEP Person educated: Patient Education method: Explanation Education comprehension: verbalized understanding  HOME EXERCISE PROGRAM: [id number]  ASSESSMENT:  CLINICAL IMPRESSION: He continues to have some tingling at the base of his skull. It has centralized to one spot whereas before it radiated into his R UT. He reports increase in tingling with any activity that requires him to reach forwards, but still feels it with just about all of the interventions today except cervical retractions. Very tight in R upper trap with stretch.   OBJECTIVE IMPAIRMENTS: pain.   REHAB POTENTIAL: Good  CLINICAL DECISION MAKING: Evolving/moderate complexity  EVALUATION COMPLEXITY: Low   GOALS: Goals reviewed with patient? Yes  SHORT TERM GOALS: Target date: 03/29/23  Patient will be independent with initial HEP.  Goal status: Met 03/17/23   LONG TERM GOALS: Target date: 04/19/23  Patient will be independent with advanced/ongoing HEP to improve outcomes and carryover.  Goal status: INITIAL  2.  Patient will report 75% improvement  in neck pain to improve QOL.  Baseline: 5/10 when it hurts Goal status: INITIAL  3.  Patient will demonstrate full pain free cervical ROM for safety with driving.  Baseline: see chart above Goal status: INITIAL  4.  Patient will report no numbness and tingling in neck  Baseline: N/T at C7 Goal status: INITIAL    PLAN:  PT FREQUENCY: 1x/week  PT DURATION: 6 weeks  PLANNED INTERVENTIONS: Therapeutic exercises, Therapeutic activity,  Neuromuscular re-education, Balance training, Gait training, Patient/Family education, Self Care, Joint mobilization, Dry Needling, Electrical stimulation, Spinal mobilization, Cryotherapy, Moist heat, Traction, Ionotophoresis 4mg /ml Dexamethasone, and Manual therapy  PLAN FOR NEXT SESSION: manual therapy to neck, postural strengthening, assess traction    Cassie Freer, PT 03/24/2023, 4:23 PM

## 2023-03-24 ENCOUNTER — Ambulatory Visit: Payer: 59

## 2023-03-24 DIAGNOSIS — M542 Cervicalgia: Secondary | ICD-10-CM | POA: Diagnosis not present

## 2023-03-24 DIAGNOSIS — R293 Abnormal posture: Secondary | ICD-10-CM | POA: Diagnosis not present

## 2023-03-24 DIAGNOSIS — M5412 Radiculopathy, cervical region: Secondary | ICD-10-CM | POA: Diagnosis not present

## 2023-04-05 ENCOUNTER — Ambulatory Visit: Payer: 59

## 2023-04-05 DIAGNOSIS — R5383 Other fatigue: Secondary | ICD-10-CM | POA: Diagnosis not present

## 2023-04-05 DIAGNOSIS — R051 Acute cough: Secondary | ICD-10-CM | POA: Diagnosis not present

## 2023-04-05 DIAGNOSIS — J029 Acute pharyngitis, unspecified: Secondary | ICD-10-CM | POA: Diagnosis not present

## 2023-04-05 DIAGNOSIS — Z03818 Encounter for observation for suspected exposure to other biological agents ruled out: Secondary | ICD-10-CM | POA: Diagnosis not present

## 2023-04-07 ENCOUNTER — Ambulatory Visit: Payer: 59 | Admitting: Psychiatry

## 2023-04-07 ENCOUNTER — Telehealth: Payer: Self-pay | Admitting: Psychiatry

## 2023-04-07 NOTE — Telephone Encounter (Signed)
Admin note for non-service contact  Patient ID: Chad Burke  MRN: [id number] DATE: 04/07/2023  Call received early afternoon -- sick today, does not want to risk contagion.  CA no charge, giving responsible notice as is able.  RS as able.  Robley Fries, PhD Marliss Czar, PhD LP Clinical Psychologist, [facility] [address] [place], [place] [address] [phone]

## 2023-04-10 DIAGNOSIS — J40 Bronchitis, not specified as acute or chronic: Secondary | ICD-10-CM | POA: Diagnosis not present

## 2023-04-10 DIAGNOSIS — R059 Cough, unspecified: Secondary | ICD-10-CM | POA: Diagnosis not present

## 2023-04-10 DIAGNOSIS — R0981 Nasal congestion: Secondary | ICD-10-CM | POA: Diagnosis not present

## 2023-04-12 ENCOUNTER — Ambulatory Visit: Payer: 59 | Attending: Neurosurgery

## 2023-04-12 DIAGNOSIS — M542 Cervicalgia: Secondary | ICD-10-CM | POA: Insufficient documentation

## 2023-04-12 DIAGNOSIS — R293 Abnormal posture: Secondary | ICD-10-CM | POA: Diagnosis not present

## 2023-04-12 DIAGNOSIS — M5412 Radiculopathy, cervical region: Secondary | ICD-10-CM | POA: Insufficient documentation

## 2023-04-12 NOTE — Therapy (Signed)
OUTPATIENT PHYSICAL THERAPY CERVICAL TREATMENT   Patient Name: Chad Burke MRN: [id number] DOB:11/12/1980, 42 y.o., male Today's Date: 04/12/2023  END OF SESSION:     Past Medical History:  Diagnosis Date   Anxiety    Sleep difficulties    No past surgical history on file. Patient Active Problem List   Diagnosis Date Noted   Chronic insomnia 06/22/2011    PCP: Gildardo Cranker  REFERRING PROVIDER: Hoyt Koch  REFERRING DIAG:  M54.2 (ICD-10-CM) - Cervicalgia      THERAPY DIAG:  No diagnosis found.  Rationale for Evaluation and Treatment: Rehabilitation  ONSET DATE: 02/01/23  SUBJECTIVE:                                                                                                                                                                                                         SUBJECTIVE STATEMENT: I have bronchitis, the neck is the same.   Hand dominance: Left  PERTINENT HISTORY:  Chad Burke is a 42 y.o. male presenting for 76-month history of persistent progressive pain at the base of his neck with associated tingling, numbness type sensations that radiate toward the trapezius and shoulder.  Symptoms initially started towards the left and now are on the right.  Has had some stiffness.  No fall, trauma, history of musculoskeletal disorders, arthritis.  Patient believes that it may have been work-related but he changed jobs and is doing work tasks and the problem persist.  Has had difficulty tolerating prednisone in the past.  PAIN:  Are you having pain? Yes: NPRS scale: 0/10 Pain location: midline c-spine and to the right Pain description: ache, numbness/tingling Aggravating factors: turning my head or side bending to the right Relieving factors: ibuprofen  PRECAUTIONS: None  WEIGHT BEARING RESTRICTIONS: No  FALLS:  Has patient fallen in last 6 months? 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 Alignment is normal. No other significant bone abnormalities are identified.   IMPRESSION: Negative cervical spine radiographs.   COGNITION: Overall cognitive status: Within functional limits for tasks assessed  SENSATION: WFL  POSTURE: rounded shoulders  PALPATION: No significant findings with palpation   CERVICAL ROM:   Active ROM A/PROM (deg) eval AROM 04/12/23  Flexion [facility]  Extension WFL   Right lateral flexion 50% WFL  Left lateral flexion 50% 50% with pain  Right rotation 75% WFL  Left rotation WFL    (Blank rows = not tested)  UPPER EXTREMITY ROM: grossly WFL   UPPER EXTREMITY MMT: grossly WFL    CERVICAL SPECIAL TESTS:  Spurling's test: Negative and Distraction test: Negative   TODAY'S TREATMENT:                                                                                                                              DATE:  04/12/23 UBE L3 x3 mins each way  Chest press 20# 2x10 Shoulder ext 10# 2x10 Diagonals with cable 10# x10 up and down on each side STM to UT and cervical paraspinals  Cervical traction 15lb x 10 min  03/24/23 UBE L3 x12mins each way Shoulder ext 10# 2x10 Rows and Lats 35# 2x10  Scap retractions green 2x10  3# shoulder flexion 2x10  3# shoulder ext behind back 2x10 Cervical retractions red 2x10 STM and passive stretching to UT and LS 30s holds   03/17/23 NuStep L 5 x 6 min Cervical retractions yellow 2x10 Horiz Abd green 2x10 STM to UT and cervical para spinales  STM at T1  Cervical traction 13lb x 10 min  03/09/24 UBE L2 x 3 min each Seated Rows & Lats 35lb 2x10 Cervical retractions yellow 2x10 Shoulder Er green 2x10  Shoulder  Ext 10lb 2x10  STM to cervical para spinales PROM to cervical spine with end range holds Went over body mechanics while on the job  03/01/23- EVAL   PATIENT EDUCATION:  Education details: POC and HEP Person educated: Patient Education method: Explanation Education comprehension: verbalized understanding  HOME EXERCISE PROGRAM: [id number]  ASSESSMENT:  CLINICAL IMPRESSION: He continues to have some tingling at the base of his skull. Still limited with R lateral flexion and reports pain. Not as tight in his upper traps compared to previous sessions. Did some light strengthening and ended with cervical traction. He has a follow up with the doctor and see what they say before contusing with PT. Will recert for another month.    OBJECTIVE IMPAIRMENTS: pain.   REHAB POTENTIAL: Good  CLINICAL DECISION MAKING: Evolving/moderate complexity  EVALUATION COMPLEXITY: Low   GOALS: Goals reviewed with patient? Yes  SHORT TERM GOALS: Target date: 03/29/23  Patient will be independent with initial HEP.  Goal status: Met 03/17/23   LONG TERM GOALS: Target date: 05/17/23  Patient will be independent with advanced/ongoing HEP to improve outcomes and carryover.  Goal status: MET  2.  Patient will report 75% improvement in neck pain to improve QOL.  Baseline: 5/10 when it hurts Goal status: MET no pain 04/12/23  3.  Patient will demonstrate full pain free cervical ROM for safety with driving.  Baseline: see chart above Goal status: IN PROGRESS  4.  Patient will report no numbness and tingling in neck  Baseline: N/T at  C7 Goal status: IN PROGRESS    PLAN:  PT FREQUENCY: 1x/week  PT DURATION: 6 weeks  PLANNED INTERVENTIONS: Therapeutic exercises, Therapeutic activity, Neuromuscular re-education, Balance training, Gait training, Patient/Family education, Self Care, Joint mobilization, Dry Needling, Electrical stimulation, Spinal mobilization, Cryotherapy, Moist heat, Traction,  Ionotophoresis 4mg /ml Dexamethasone, and Manual therapy  PLAN FOR NEXT SESSION: manual therapy to neck, postural strengthening, assess traction    Cassie Freer, PT 04/12/2023, 9:18 AM

## 2023-04-19 DIAGNOSIS — Z833 Family history of diabetes mellitus: Secondary | ICD-10-CM | POA: Diagnosis not present

## 2023-04-19 DIAGNOSIS — E559 Vitamin D deficiency, unspecified: Secondary | ICD-10-CM | POA: Diagnosis not present

## 2023-04-19 DIAGNOSIS — Z13220 Encounter for screening for lipoid disorders: Secondary | ICD-10-CM | POA: Diagnosis not present

## 2023-04-19 DIAGNOSIS — Z0000 Encounter for general adult medical examination without abnormal findings: Secondary | ICD-10-CM | POA: Diagnosis not present

## 2023-04-22 DIAGNOSIS — E559 Vitamin D deficiency, unspecified: Secondary | ICD-10-CM | POA: Diagnosis not present

## 2023-04-22 DIAGNOSIS — R6882 Decreased libido: Secondary | ICD-10-CM | POA: Diagnosis not present

## 2023-04-22 DIAGNOSIS — Z0000 Encounter for general adult medical examination without abnormal findings: Secondary | ICD-10-CM | POA: Diagnosis not present

## 2023-04-22 DIAGNOSIS — Z6827 Body mass index (BMI) 27.0-27.9, adult: Secondary | ICD-10-CM | POA: Diagnosis not present

## 2023-04-29 ENCOUNTER — Ambulatory Visit (INDEPENDENT_AMBULATORY_CARE_PROVIDER_SITE_OTHER): Payer: 59 | Admitting: Psychiatry

## 2023-04-29 DIAGNOSIS — Z87898 Personal history of other specified conditions: Secondary | ICD-10-CM | POA: Diagnosis not present

## 2023-04-29 DIAGNOSIS — F341 Dysthymic disorder: Secondary | ICD-10-CM | POA: Diagnosis not present

## 2023-04-29 DIAGNOSIS — F4010 Social phobia, unspecified: Secondary | ICD-10-CM

## 2023-04-29 DIAGNOSIS — F4001 Agoraphobia with panic disorder: Secondary | ICD-10-CM | POA: Diagnosis not present

## 2023-04-29 DIAGNOSIS — Z79899 Other long term (current) drug therapy: Secondary | ICD-10-CM

## 2023-04-29 NOTE — Progress Notes (Signed)
Psychotherapy Progress Note [facility] Marliss Czar, PhD LP  Patient ID: Chad Burke [facility])    MRN: [id number] Therapy format: Individual psychotherapy Date: 04/29/2023      Start: 4:20p     Stop: 5:05p     Time Spent: 45 min Location: In-person   Session narrative (presenting needs, interim history, self-report of stressors and symptoms, applications of prior therapy, status changes, and interventions made in session) Has weaned to 1mg  Klonopin now, reducing in quarter mgs.  Overall less apprehensive, still, and in a much happier place.  Wants to start weaning Prozac, but easily understands it's probably better to only change one fundamental thing at a time.    Enjoying his job -- great to be in job he does not dread, with a nonanxious culture, room to learn, feeling useful, getting unsolicited affirmations.  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  Pleased to find himself more open to letting dad retire as he sees fit.    Money still tight, too tight to buy a house.  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  Promoted, too, but it is seasonal work, after all.  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  Has made the adjustment   Chad Burke is starting his final year for associate's degree (live sound & light).  Begged off on summer classes, so he has some more   Therapeutic modalities: Cognitive Behavioral Therapy and Solution-Oriented/Positive Psychology  Mental Status/Observations:  Appearance:   Casual     Behavior:  Appropriate  Motor:  Normal  Speech/Language:   Clear and Coherent  Affect:  Appropriate  Mood:  normal  Thought process:  normal  Thought content:    WNL  Sensory/Perceptual disturbances:    WNL  Orientation:  Fully oriented  Attention:  Good     Concentration:  Good  Memory:  WNL  Insight:    Good  Judgment:   Good  Impulse Control:  Good   Risk Assessment: Danger to Self: No Self-injurious Behavior: No Danger to Others: No Physical Aggression / Violence: No Duty to Warn: No Access to Firearms a concern: No  Assessment of progress:  progressing  Diagnosis:   ICD-10-CM   1. Panic disorder with agoraphobia - stable  F40.01     2. Social anxiety disorder - stable  F40.10     3. Long-term current use of benzodiazepine - weaning  Z79.899     4. Dysthymic disorder - controlled  F34.1     5. History of suspected LD and known substance use, long sober  Z87.898      Plan:  Current: Exercise -- if not gym, find short exercise "snacks", and limber up well Marital -- practice actually sharing what's on his mind with wife, and give her clearance to not settle for "fine" or "nothing" Best to limit phone surfing unless/until it's intentional recreation -- balance of sharing and individual recreation, as long as it's not trying to avoid thoughts and feelings Meds/nutrition Endorse Auvelity and weaning clonazepam Advocate vitamin D Recommend Super B complex for nutritional balance, stress effects, and alertness Most likely recommend omega 3 for suspected cholesterol issue and general antiinflammation Option MCT and/or partial keto dieting to reduce carb sensitivity Sleep melatonin timing about 2 hours before bedtime,  as tolerated; may use amber glasses to help set internal clock PRN waking techniques -- light, water, chill, brisk awakening; option to ask psychiatry about an overnight-release stimulant continue other measures PRN, consult on sleep hygiene PRN Occupational/financial 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  Option to train further on panic-control techniques.  Cognitive -- Pay attention to conclusion-jumping thoughts and try to call "time out".  Resolve to let things be possible, even if they are hard, or anxiety-provoking.  Admit not knowing what "will" go wrong.  On the job, use the 5 "I can" statements above.  Self-affirm progress and successes, confidences fulfilled. Emotional expression and support  Take wife up on the offer to hear out how he feels, what he's struggling with PRN let family know if well-intentioned support backfires or raises anxiety instead.  Option to include in therapy PRN. Other recommendations/advice -- As may be noted above.  Continue to utilize previously learned skills ad lib. Medication compliance -- Maintain medication as prescribed and work faithfully with relevant prescriber(s) if any changes are desired or seem indicated. Crisis service -- Aware of call list and work-in appts.  Call the clinic on-call service, 988/hotline, 911, or present to [facility] or ER if any life-threatening psychiatric crisis. Followup -- Return for time as already scheduled, avail earlier @ PT's need, recommend sched ahead.  Next scheduled visit with me 05/26/2023.  Next scheduled in this office 05/26/2023.  Robley Fries, PhD Marliss Czar, PhD LP Clinical Psychologist, [facility] [address] [place], [place] [address] [phone]

## 2023-05-02 DIAGNOSIS — Z6826 Body mass index (BMI) 26.0-26.9, adult: Secondary | ICD-10-CM | POA: Diagnosis not present

## 2023-05-02 DIAGNOSIS — R208 Other disturbances of skin sensation: Secondary | ICD-10-CM | POA: Diagnosis not present

## 2023-05-02 DIAGNOSIS — M542 Cervicalgia: Secondary | ICD-10-CM | POA: Diagnosis not present

## 2023-05-26 ENCOUNTER — Ambulatory Visit (INDEPENDENT_AMBULATORY_CARE_PROVIDER_SITE_OTHER): Payer: 59 | Admitting: Psychiatry

## 2023-05-26 DIAGNOSIS — Z87898 Personal history of other specified conditions: Secondary | ICD-10-CM | POA: Diagnosis not present

## 2023-05-26 DIAGNOSIS — Z79899 Other long term (current) drug therapy: Secondary | ICD-10-CM | POA: Diagnosis not present

## 2023-05-26 DIAGNOSIS — F341 Dysthymic disorder: Secondary | ICD-10-CM

## 2023-05-26 DIAGNOSIS — F4001 Agoraphobia with panic disorder: Secondary | ICD-10-CM | POA: Diagnosis not present

## 2023-05-26 DIAGNOSIS — F4010 Social phobia, unspecified: Secondary | ICD-10-CM

## 2023-05-26 NOTE — Progress Notes (Signed)
Psychotherapy Progress Note [facility] Marliss Czar, PhD LP  Patient ID: Chad Burke [facility])    MRN: [id number] Therapy format: Individual psychotherapy Date: 05/26/2023      Start: 4:15p     Stop: 5:00p     Time Spent: 45 min Location: In-person   Session narrative (presenting needs, interim history, self-report of stressors and symptoms, applications of prior therapy, status changes, and interventions made in session) Wearied by [employer] and the tone of things lately.  Had been able to just lay it down more until [employer] attempt story.  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  [name] has come to putting in an application now, her Sjogren's being more stable, and finally finding a suitable opportunity to work, too.  In his own work, distinctly seeing himself more resilient with pressure days, feeling ever more confident he can handle the working world the way it is.    Re. medications, has now come down from 3 or 4 Klonopin + 10mg  Ambien to sleep + (7-8 yrs ago) + a lot of melatonin to 1mg  Klonopin and none needed.  Affirmed and encouraged in sleep discipline.  Looking ahead to school year starting again. Can still feel wistful about young daughter getting out in the world, but clear that it has worked out OK to see her begin preschool.  Affirmed and encouraged.  Therapeutic modalities: Cognitive Behavioral Therapy, Solution-Oriented/Positive Psychology, and Ego-Supportive  Mental Status/Observations:  Appearance:   Casual     Behavior:  Appropriate  Motor:  Normal  Speech/Language:   Clear and Coherent  Affect:  Appropriate  Mood:  normal  Thought process:  normal  Thought content:    WNL  Sensory/Perceptual disturbances:    WNL  Orientation:  Fully oriented  Attention:  Good     Concentration:  Good  Memory:  WNL  Insight:    Good  Judgment:   Good  Impulse Control:  Good   Risk Assessment: Danger to Self: No Self-injurious Behavior: No Danger to Others: No Physical Aggression / Violence: No Duty to Warn: No Access to Firearms a concern: No  Assessment of progress:  progressing well  Diagnosis:   ICD-10-CM   1. Panic disorder with agoraphobia - stable  F40.01     2. Social anxiety disorder - stable  F40.10     3. Long-term current use of benzodiazepine - weaning  Z79.899     4. Dysthymic disorder - controlled  F34.1     5. History of suspected LD and known substance use, long sober  Z87.898      Plan:  Current: Exercise -- if not gym, find short exercise "snacks", and limber up well Marital -- practice actually sharing what's on his mind with wife, and give her clearance to not settle for "fine" or "nothing" Best to limit phone surfing unless/until it's intentional recreation -- balance of sharing and individual recreation, as long as it's not trying to avoid thoughts and feelings Meds/nutrition Endorse Auvelity and weaning clonazepam Advocate vitamin D Recommend Super B complex for nutritional balance, stress effects, and alertness Most likely recommend omega 3 for suspected cholesterol issue and general antiinflammation Option MCT and/or partial keto dieting to reduce carb sensitivity Sleep melatonin timing about 2 hours before bedtime, as tolerated; may use amber glasses to help set internal clock PRN  waking techniques -- light, water, chill, brisk awakening; option to ask psychiatry about an overnight-release stimulant continue other measures PRN, consult on sleep hygiene PRN Occupational/financial 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  Option to train further on panic-control techniques.  Cognitive -- Pay attention to conclusion-jumping thoughts and try to call "time out".  Resolve to let things be possible, even if they are hard, or anxiety-provoking.  Admit not knowing what "will" go wrong.  On the job, use the 5 "I can" statements above.  Self-affirm progress and successes, confidences fulfilled. Emotional expression and support  Take wife up on the offer to hear out how he feels, what he's struggling with PRN let family know if well-intentioned support backfires or raises anxiety instead.  Option to include in therapy PRN. Other recommendations/advice -- As may be noted above.  Continue to utilize previously learned skills ad lib. Medication compliance -- Maintain medication as prescribed and work faithfully with relevant prescriber(s) if any changes are desired or seem indicated. Crisis service -- Aware of call list and work-in appts.  Call the clinic on-call service, 988/hotline, 911, or present to [facility] or ER if any life-threatening psychiatric crisis. Followup -- Return for time as already scheduled.  Next scheduled visit with me 07/12/2023.  Next scheduled in this office 07/12/2023.  Robley Fries, PhD Marliss Czar, PhD LP Clinical Psychologist, [facility] [address] [place], [place] [address] [phone]

## 2023-06-07 DIAGNOSIS — M542 Cervicalgia: Secondary | ICD-10-CM | POA: Diagnosis not present

## 2023-06-27 DIAGNOSIS — M542 Cervicalgia: Secondary | ICD-10-CM | POA: Diagnosis not present

## 2023-06-27 DIAGNOSIS — Z6826 Body mass index (BMI) 26.0-26.9, adult: Secondary | ICD-10-CM | POA: Diagnosis not present

## 2023-06-27 DIAGNOSIS — R208 Other disturbances of skin sensation: Secondary | ICD-10-CM | POA: Diagnosis not present

## 2023-07-12 ENCOUNTER — Ambulatory Visit (INDEPENDENT_AMBULATORY_CARE_PROVIDER_SITE_OTHER): Payer: 59 | Admitting: Psychiatry

## 2023-07-12 DIAGNOSIS — F4010 Social phobia, unspecified: Secondary | ICD-10-CM | POA: Diagnosis not present

## 2023-07-12 DIAGNOSIS — F4001 Agoraphobia with panic disorder: Secondary | ICD-10-CM | POA: Diagnosis not present

## 2023-07-12 DIAGNOSIS — Z79899 Other long term (current) drug therapy: Secondary | ICD-10-CM | POA: Diagnosis not present

## 2023-07-12 DIAGNOSIS — F341 Dysthymic disorder: Secondary | ICD-10-CM | POA: Diagnosis not present
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
## 2023-08-11 ENCOUNTER — Ambulatory Visit (INDEPENDENT_AMBULATORY_CARE_PROVIDER_SITE_OTHER): Payer: 59 | Admitting: Psychiatry

## 2023-08-11 DIAGNOSIS — F4010 Social phobia, unspecified: Secondary | ICD-10-CM | POA: Diagnosis not present

## 2023-08-11 DIAGNOSIS — F4001 Agoraphobia with panic disorder: Secondary | ICD-10-CM

## 2023-08-11 DIAGNOSIS — F341 Dysthymic disorder: Secondary | ICD-10-CM

## 2023-08-11 NOTE — Progress Notes (Signed)
 Psychotherapy Progress Note [facility] Marliss Czar, PhD LP  Patient ID: Chad Burke [facility])    MRN: [id number] Therapy format: Individual psychotherapy Date: 08/11/2023      Start: 2:10p     Stop: 2:55p     Time Spent: 45 min Location: In-person   Session narrative (presenting needs, interim history, self-report of stressors and symptoms, applications of prior therapy, status changes, and interventions made in session) Past couple weeks has noticed a few lows coming back.  Could be seasonal, with light levels going down.  Was struggling some with infrequent tasks at work last week, felt down, and then felt some relapse in judgmental thinking, which made him anxious on top of it, lest he relapse.  Discussed as most likely seasonal, light-based changes change.  Does recognize a hx of winter depression, and already tends to get lights on.  Last week found himself pushing himself out to the gym one morning, which felt good.  Encouraged further in practices of morning stimulation, including light, stretching, water, movement and oriented to brisk awakening.    Wondering if he needs to make a medication adjustment next week at his 6-mo f/u.  Discussed options, suggested he give the self-care measures (the "nursing" approach) a good try first and maybe arrange the option to either add low-dose Wellbutrin depending on results, or swap out 20mg  of his 60mg  Prozac for it, on top of the [place] he has.  Can convey the idea himself to his prescriber.  Therapeutic modalities: Cognitive Behavioral Therapy, Solution-Oriented/Positive Psychology, and Psycho-education/Bibliotherapy  Mental Status/Observations:  Appearance:   Casual     Behavior:  Appropriate  Motor:  Normal  Speech/Language:   Clear and Coherent  Affect:  Appropriate  Mood:  A little down, some anxiety  Thought process:  normal  Thought content:    WNL  Sensory/Perceptual disturbances:    WNL  Orientation:   Fully oriented  Attention:  Good    Concentration:  Good  Memory:  WNL  Insight:    Good  Judgment:   Good  Impulse Control:  Good   Risk Assessment: Danger to Self: No Self-injurious Behavior: No Danger to Others: No Physical Aggression / Violence: No Duty to Warn: No Access to Firearms a concern: No  Assessment of progress:  situational setback(s)  Diagnosis:   ICD-10-CM   1. Panic disorder with agoraphobia - stable  F40.01     2. Social anxiety disorder - stable  F40.10     3. Dysthymic disorder - controlled  F34.1      Plan:  Meds/nutrition Endorse Auvelity and weaning clonazepam Advocate vitamin D Recommend Super B complex for nutritional balance, stress effects, and alertness Most likely recommend omega 3 for suspected cholesterol issue and general antiinflammation Option MCT and/or partial keto dieting to reduce carb sensitivity For winter depression, may ask adjustment of prescriber or hold and see what lifestyle and cognitive coping will do Sleep/energy melatonin timing about 2 hours before bedtime, as tolerated; may use amber glasses to help set internal clock PRN waking techniques -- light, water, chill, brisk awakening option to ask psychiatry about an overnight-release stimulant continue other measures PRN, consult on sleep hygiene PRN Exercise -- if not gym, find short exercise "snacks", and limber up well Occupational/financial 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 Option certify for insurance work For intimidating tasks to face or learn things, try to  break down to do-able pieces and refocus, trust steps to work out [name] available if needed, despite disappointing experience Anxiety/worry exposure Attitude -- Continue generally an attitude of going in anyway and capitalizing on opportunities to break cycles of  obsessing, catastrophizing, avoidance Self-soothing -- Practice diaphragmatic breathing and paced breathing techniques for anxiety symptom management.  Option to train further on panic-control techniques.  Cognitive -- Pay attention to conclusion-jumping thoughts and try to call "time out".  Resolve to let things be possible, even if they are hard, or anxiety-provoking.  Admit not knowing what "will" go wrong.  On the job, use the 5 "I can" statements above.  Self-affirm progress and successes, confidences fulfilled. Emotional expression and support  Take wife up on the offer to hear out how he feels, what he's struggling with PRN let family know if well-intentioned support backfires or raises anxiety instead.  Option to include in therapy PRN. Practice actually sharing what's on his mind with wife, and give her clearance to not settle for "fine" or "nothing" Best to curfew phone surfing when together unless purposefully sharing phone input as together time, as long as it doesn't serve to avoid more vulnerable thoughts and feelings Other recommendations/advice -- As may be noted above.  Continue to utilize previously learned skills ad lib. Medication compliance -- Maintain medication as prescribed and work faithfully with relevant prescriber(s) if any changes are desired or seem indicated. Crisis service -- Aware of call list and work-in appts.  Call the clinic on-call service, 988/hotline, 911, or present to [facility] or ER if any life-threatening psychiatric crisis. Followup -- Return for time as already scheduled.  Next scheduled visit with me 09/13/2023.  Next scheduled in this office 09/13/2023.  Robley Fries, PhD Marliss Czar, PhD LP Clinical Psychologist, [facility] [address], [address] [place], [place] [address] [phone]

## 2023-08-16 DIAGNOSIS — F410 Panic disorder [episodic paroxysmal anxiety] without agoraphobia: Secondary | ICD-10-CM | POA: Diagnosis not present

## 2023-08-16 DIAGNOSIS — G4726 Circadian rhythm sleep disorder, shift work type: Secondary | ICD-10-CM | POA: Diagnosis not present

## 2023-08-16 DIAGNOSIS — Z5181 Encounter for therapeutic drug level monitoring: Secondary | ICD-10-CM | POA: Diagnosis not present

## 2023-08-16 DIAGNOSIS — F3342 Major depressive disorder, recurrent, in full remission: Secondary | ICD-10-CM | POA: Diagnosis not present

## 2023-08-16 DIAGNOSIS — F332 Major depressive disorder, recurrent severe without psychotic features: Secondary | ICD-10-CM | POA: Diagnosis not present

## 2023-09-13 ENCOUNTER — Ambulatory Visit: Payer: 59 | Admitting: Psychiatry

## 2023-10-13 ENCOUNTER — Ambulatory Visit: Payer: 59 | Admitting: Psychiatry

## 2023-10-13 DIAGNOSIS — F4010 Social phobia, unspecified: Secondary | ICD-10-CM

## 2023-10-13 DIAGNOSIS — Z599 Problem related to housing and economic circumstances, unspecified: Secondary | ICD-10-CM

## 2023-10-13 DIAGNOSIS — F902 Attention-deficit hyperactivity disorder, combined type: Secondary | ICD-10-CM | POA: Diagnosis not present

## 2023-10-13 DIAGNOSIS — F4001 Agoraphobia with panic disorder: Secondary | ICD-10-CM | POA: Diagnosis not present

## 2023-10-13 DIAGNOSIS — F341 Dysthymic disorder: Secondary | ICD-10-CM
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
## 2023-11-22 ENCOUNTER — Ambulatory Visit: Payer: 59 | Admitting: Psychiatry

## 2023-12-22 ENCOUNTER — Ambulatory Visit (INDEPENDENT_AMBULATORY_CARE_PROVIDER_SITE_OTHER): Payer: 59 | Admitting: Psychiatry

## 2023-12-22 DIAGNOSIS — F341 Dysthymic disorder: Secondary | ICD-10-CM | POA: Diagnosis not present

## 2023-12-22 DIAGNOSIS — Z599 Problem related to housing and economic circumstances, unspecified: Secondary | ICD-10-CM | POA: Diagnosis not present

## 2023-12-22 DIAGNOSIS — F4001 Agoraphobia with panic disorder: Secondary | ICD-10-CM

## 2023-12-22 DIAGNOSIS — F4010 Social phobia, unspecified: Secondary | ICD-10-CM

## 2023-12-22 DIAGNOSIS — F902 Attention-deficit hyperactivity disorder, combined type: Secondary | ICD-10-CM

## 2023-12-22 NOTE — Progress Notes (Signed)
 Psychotherapy Progress Note [facility] Marliss Czar, PhD LP  Patient ID: RONAL [place] [facility])    MRN: [id number] Therapy format: Individual psychotherapy Date: 12/22/2023      Start: 4:16p     Stop: 5:03p     Time Spent: 47 min Location: In-person   Session narrative (presenting needs, interim history, self-report of stressors and symptoms, applications of prior therapy, status changes, and interventions made in session) 2 months since last seen, dextroamphetamine working out well for attention/concentration and feeling some mood benefit.  Got insurance through the [employer], now on a [employer] "rearranged plan", with eroded benefits from last year.  [name] was treated with high dose prednisone and a slow taper that seemed to correct her worrisome liver issue, though she had to deal with a dropped referral for specialist care.  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    In his own job, passed the 1-year mark as noted and took up the issue of a raise.  Boss redirected the conversation to benefits, called in the supervisor, and tried to negotiate a longer work schedule, all seemingly trying to dilute the request and somewhat lamely avoid conflict.  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  Currently trying to nail down time with manager to complete the raise conversation.  Briefly discussed communication choices if he needs to meet resistance, settling on a "Let me show you how well I've earned it" approach.  [name], for her part, also has a new lead to be part-time, not confirmed yet.    [name] is finishing preschool this spring, starting kindergarten in the fall.  Looking to [employer].  Commended on getting through  feared adjustment losing time with her going to preschool and affirmed in going forward with the next phase, enjoying new expressions of fatherhood.    Therapeutic modalities: Cognitive Behavioral Therapy, Solution-Oriented/Positive Psychology, and Ego-Supportive  Mental Status/Observations:  Appearance:   Casual     Behavior:  Appropriate  Motor:  Normal  Speech/Language:   Clear and Coherent  Affect:  Appropriate  Mood:  normal  Thought process:  normal  Thought content:    WNL  Sensory/Perceptual disturbances:    WNL  Orientation:  Fully oriented  Attention:  Good    Concentration:  Good  Memory:  WNL  Insight:    Good  Judgment:   Good  Impulse Control:  Good   Risk Assessment: Danger to Self: No Self-injurious Behavior: No Danger to Others: No Physical Aggression / Violence: No Duty to Warn: No Access to Firearms a concern: No  Assessment of progress:  progressing  Diagnosis:   ICD-10-CM   1. Panic disorder with agoraphobia - stable  F40.01     2. Social anxiety disorder - stable  F40.10     3. Dysthymic disorder - controlled  F34.1     4. Attention deficit hyperactivity disorder (ADHD), combined type  F90.2     5. Financial difficulty  Z59.9      Plan:  Meds/nutrition Endorse Auvelity and weaning clonazepam Advocate vitamin D Recommend Super B complex for nutritional balance, stress effects, and alertness Most likely recommend omega 3 for suspected cholesterol issue and general antiinflammation Option MCT and/or partial keto dieting to reduce  carb sensitivity For winter depression, may ask adjustment of prescriber or hold and see what lifestyle and cognitive coping will do Sleep/energy melatonin timing about 2 hours before bedtime, as tolerated; may use amber glasses to help set internal clock PRN waking techniques -- light, water, chill, brisk awakening option to ask psychiatry about an overnight-release stimulant continue other measures PRN, consult on  sleep hygiene PRN Exercise -- if not gym, find short exercise "snacks", and limber up well Occupational/financial 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  Option to train further on panic-control techniques.  Cognitive -- Pay attention to conclusion-jumping thoughts and try to call "time out".  Resolve to let things be possible, even if they are hard, or anxiety-provoking.  Admit not knowing what "will" go wrong.  On the job, use the 5 "I can" statements above.  Self-affirm progress and successes, confidences fulfilled. Emotional expression and support  Take wife up on the offer to hear out how he feels, what he's struggling with PRN let family know if well-intentioned support backfires or raises anxiety instead.  Option to include in therapy PRN. Practice actually sharing what's on his mind with wife, and give her clearance to not settle for "fine" or "nothing" Best to curfew phone surfing when together unless purposefully sharing phone input as together time, as long as it doesn't serve to avoid more vulnerable thoughts and feelings Other recommendations/advice -- As may be noted above.  Continue to utilize previously learned skills ad lib. Medication compliance -- Maintain medication as prescribed and work faithfully with relevant prescriber(s) if any changes are  desired or seem indicated. Crisis service -- Aware of call list and work-in appts.  Call the clinic on-call service, 988/hotline, 911, or present to [facility] or ER if any life-threatening psychiatric crisis. Followup -- Return for time as already scheduled.  Next scheduled visit with me 01/19/2024.  Next scheduled in this office 01/19/2024.  Robley Fries, PhD Marliss Czar, PhD LP Clinical Psychologist, [facility] [address] [place], [place] [address] [phone]

## 2024-01-19 ENCOUNTER — Ambulatory Visit (INDEPENDENT_AMBULATORY_CARE_PROVIDER_SITE_OTHER): Payer: 59 | Admitting: Psychiatry

## 2024-01-19 DIAGNOSIS — F341 Dysthymic disorder: Secondary | ICD-10-CM | POA: Diagnosis not present

## 2024-01-19 DIAGNOSIS — F4001 Agoraphobia with panic disorder: Secondary | ICD-10-CM | POA: Diagnosis not present

## 2024-01-19 DIAGNOSIS — F4010 Social phobia, unspecified: Secondary | ICD-10-CM | POA: Diagnosis not present

## 2024-01-19 DIAGNOSIS — Z566 Other physical and mental strain related to work: Secondary | ICD-10-CM | POA: Diagnosis not present
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
## 2024-02-14 DIAGNOSIS — F3342 Major depressive disorder, recurrent, in full remission: Secondary | ICD-10-CM | POA: Diagnosis not present

## 2024-02-21 ENCOUNTER — Ambulatory Visit: Payer: 59 | Admitting: Psychiatry

## 2024-03-13 ENCOUNTER — Ambulatory Visit (INDEPENDENT_AMBULATORY_CARE_PROVIDER_SITE_OTHER): Payer: 59 | Admitting: Psychiatry

## 2024-03-13 DIAGNOSIS — F4010 Social phobia, unspecified: Secondary | ICD-10-CM | POA: Diagnosis not present

## 2024-03-13 DIAGNOSIS — Z569 Unspecified problems related to employment: Secondary | ICD-10-CM | POA: Diagnosis not present

## 2024-03-13 DIAGNOSIS — F902 Attention-deficit hyperactivity disorder, combined type: Secondary | ICD-10-CM

## 2024-03-13 DIAGNOSIS — F4001 Agoraphobia with panic disorder: Secondary | ICD-10-CM

## 2024-03-13 DIAGNOSIS — F341 Dysthymic disorder: Secondary | ICD-10-CM | POA: Diagnosis not present

## 2024-03-13 NOTE — Progress Notes (Signed)
 Psychotherapy Progress Note [facility] Chad Ferry, PhD LP  Patient ID: Chad Burke [facility])    MRN: [id number] Therapy format: Individual psychotherapy Date: 03/13/2024      Start: 4:15p     Stop: 5:01p     Time Spent: 46 min Location: In-person   Session narrative (presenting needs, interim history, self-report of stressors and symptoms, applications of prior therapy, status changes, and interventions made in session) Work situation calmed down eventually, and guardedness with it.  2nd incident 2 weeks ago where supervisor Chad Burke called him in about being on his phone, and again it had been rumor-mongering by the woman mentioned before.  Worked out a better understanding with Teaching laboratory technician, and figure no more such incidents to come.  Also worked it out with Chad Burke about not talking to him, turned out to be just two guys making assumptions from the tension.  Has adopted a Librarian, academic with the troublemaker coworker at this point, content that he is doing a good job with the tasks he has, and when it's honestly slow it's OK to check phone.    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  Got inspired that he needs to try so he doesn't regret it.  Now got a loan, a Merchant navy officer, working on Chemical engineer, have invested in Engineer, agricultural.  Got a van, accepting dad's suggestion to develop the business and equip for it.  Obtained a loan for better machinery, working up wraps, exciting and naturally intimidating.  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  Figures if he does need to relinquish his position, it'll be worth it to take his shot at independent business, an most likely he would be re-hire-able if the venture goes badly.  Affirmed and encouraged.  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  Abiding worry is that she will experience  learning issues, anxiety, have a negative experience like he did.  She tends to play by herself more, for one.  Dance class 1/wk has helped a lot.  Considering martial arts class.  Want to identify a church, and Sunday school.  Got alarmed when she asked him if he knew girls can marry girls and that girls and boys can turn into the other.  Affirmed and encouraged.  Therapeutic modalities: Cognitive Behavioral Therapy, Solution-Oriented/Positive Psychology, and Ego-Supportive  Mental Status/Observations:  Appearance:   Casual     Behavior:  Appropriate  Motor:  Normal  Speech/Language:   Clear and Coherent  Affect:  Appropriate  Mood:  normal  Thought process:  normal  Thought content:    WNL  Sensory/Perceptual disturbances:    WNL  Orientation:  Fully oriented  Attention:  Good    Concentration:  Good  Memory:  WNL  Insight:    Good  Judgment:   Good  Impulse Control:  Good   Risk Assessment: Danger to Self: No Self-injurious Behavior: No Danger to Others: No Physical Aggression / Violence: No Duty to Warn: No Access to Firearms a concern: No  Assessment of progress:  progressing well  Diagnosis:   ICD-10-CM   1. Panic disorder with agoraphobia - stable  F40.01     2. Social anxiety disorder - stable  F40.10     3. Dysthymic disorder - controlled  F34.1     4. Attention deficit hyperactivity disorder (ADHD), combined type  F90.2  5. Occupational problem  Z56.9      Plan:  Meds/nutrition Endorse Auvelity and weaning clonazepam  Advocate vitamin D Recommend Super B complex for nutritional balance, stress effects, and alertness Most likely recommend omega 3 for suspected cholesterol issue and general antiinflammation Option MCT and/or partial keto dieting to reduce carb sensitivity For winter depression, may ask adjustment of prescriber or hold and see what lifestyle and cognitive coping will do Sleep/energy melatonin timing about 2 hours before bedtime, as  tolerated; may use amber glasses to help set internal clock PRN waking techniques -- light, water, chill, brisk awakening option to ask psychiatry about an overnight-release stimulant continue other measures PRN, consult on sleep hygiene PRN Exercise -- if not gym, find short exercise "snacks", and limber up well Occupational/financial Endorse maintaining current combination of part-time industrial work Designer, television/film set) and self-employed (Patent examiner, 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 Continue constructive and assertive relationship with management at the belt company For intimidating tasks to face or learn things, try to break down to do-able pieces and refocus, trust steps to work out Chad Burke available if needed, despite disappointing experience, though highly unlikely at this point Anxiety/worry exposure Attitude -- Continue generally an attitude of going in anyway and capitalizing on opportunities to break cycles of obsessing, catastrophizing, avoidance Self-soothing -- Practice diaphragmatic breathing and paced breathing techniques for anxiety symptom management.  Option to train further on panic-control techniques.  Cognitive -- Pay attention to conclusion-jumping thoughts and try to call "time out".  Resolve to let things be possible, even if they are hard, or anxiety-provoking.  Admit not knowing what "will" go wrong.  On the job, use the 5 "I can" statements above.  Self-affirm progress and successes, confidences fulfilled. Emotional expression and support  Take wife up on the offer to hear out how he feels, what he's struggling with PRN let family know if well-intentioned support backfires or raises anxiety instead.  Option to include in therapy PRN. Practice actually sharing what's on his mind with wife, and give her clearance to not settle for "fine" or "nothing" Best to curfew phone surfing  when together unless purposefully sharing phone input as together time, as long as it doesn't serve to avoid more vulnerable thoughts and feelings Other recommendations/advice -- As may be noted above.  Continue to utilize previously learned skills ad lib. Medication compliance -- Maintain medication as prescribed and work faithfully with relevant prescriber(s) if any changes are desired or seem indicated. Crisis service -- Aware of call list and work-in appts.  Call the clinic on-call service, 988/hotline, 911, or present to [facility] or ER if any life-threatening psychiatric crisis. Followup -- Return for time as already scheduled.  Next scheduled visit with me 04/12/2024.  Next scheduled in this office 04/12/2024.  Maretta Shaper, PhD Chad Ferry, PhD LP Clinical Psychologist, [facility] [address], [address] [place], [place] [address] [phone]

## 2024-04-11 DIAGNOSIS — L299 Pruritus, unspecified: Secondary | ICD-10-CM | POA: Diagnosis not present

## 2024-04-11 DIAGNOSIS — J029 Acute pharyngitis, unspecified: Secondary | ICD-10-CM | POA: Diagnosis not present

## 2024-04-11 DIAGNOSIS — R0981 Nasal congestion: Secondary | ICD-10-CM | POA: Diagnosis not present

## 2024-04-11 DIAGNOSIS — B351 Tinea unguium: Secondary | ICD-10-CM | POA: Diagnosis not present

## 2024-04-12 ENCOUNTER — Ambulatory Visit: Payer: 59 | Admitting: Psychiatry

## 2024-04-20 DIAGNOSIS — Z0000 Encounter for general adult medical examination without abnormal findings: Secondary | ICD-10-CM | POA: Diagnosis not present

## 2024-05-22 ENCOUNTER — Ambulatory Visit (INDEPENDENT_AMBULATORY_CARE_PROVIDER_SITE_OTHER): Admitting: Psychiatry

## 2024-05-22 DIAGNOSIS — F4001 Agoraphobia with panic disorder: Secondary | ICD-10-CM

## 2024-05-22 DIAGNOSIS — Z658 Other specified problems related to psychosocial circumstances: Secondary | ICD-10-CM | POA: Diagnosis not present

## 2024-05-22 DIAGNOSIS — Z599 Problem related to housing and economic circumstances, unspecified: Secondary | ICD-10-CM

## 2024-05-22 DIAGNOSIS — Z569 Unspecified problems related to employment: Secondary | ICD-10-CM | POA: Diagnosis not present

## 2024-05-22 DIAGNOSIS — F4010 Social phobia, unspecified: Secondary | ICD-10-CM

## 2024-05-22 NOTE — Progress Notes (Signed)
 Psychotherapy Progress Note [facility] Jodie Kendall, PhD LP  Patient ID: Chad Burke [facility])    MRN: [id number] Therapy format: Individual psychotherapy Date: 05/22/2024      Start: 3:07p     Stop: 3:55p     Time Spent: 48 min Location: In-person   Session narrative (presenting needs, interim history, self-report of stressors and symptoms, applications of prior therapy, status changes, and interventions made in session) Stress is up lately.  Preparing to go to the beach.  Money still tight, with apartment and cars aging.  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  Sunday awoke with unusual headache not explained by caffeine withdrawal, tension, or constipation.  Nauseous headache, not explained by failure to eat.  Eventually checked BP, found 145/95.  Typically takes his 3 morning meds before eating, yesterday accidentally double-dosed (meaning 120mg  Prozac, 60mg  Dextroamphetamine, and 2x 45-105 Auvelity.  Realized, made arrangements, sought medical advice, but got very disappointed with [facility] not returning call.  Affirmed and encouraged pursuing the issue with his psychiatrist.  Background stress continues with worry over [name], plus [name]'s extra energy and loads of curious questions.  Then there's mom, who keeps [name] 2 full days/wk, is a Teacher, early years/pre, a Estate agent, and a frequent guilter.  And current issues about them needing to find more space to live in, while M pressing [employer] to come help his dad put things together.  Pulled in many directions.  Healthwise, reports polyuria, and on OTC herbal testosterone booster, wonders if they're related.  Interpreted most likely ANS related, nervous bladder activation.  Advised on recognizing anticipatory fight-flight states, soothing tension/anxiety, and retraining the bladder with intentional waiting periods before going.  Pelvic floor  exercises if needed to strengthen sphincter control.  Always possible he may need to reduce his stimulant as well.  Has also discerned he's fallen off his conscious contact with God and has come to feel so guilty.  Praying again, and putting [employer] back on radio.  Frustrated even with his prayer, which he regards as juvenile and gratitude-bound.  Then yesterday had a more transcendant experience just talking to God as if human and tangible, found himself remarkably fluent with words of yearning and appeal to God, had a deeper spiritual experience.  Validated the idea that good prayer starts with just talking, and hopefully 2-way (listening, too), and reframed guilt as a check engine light not a life sentence -- a signal to evaluate how behavior and values are aligning.  Has a number of dimensions in which he wants to be more faithful, including church attendance and indulging lustful visual exposures (not porn).  Normalized wishes and feelings and encouraged in working through his dissatisfactions with grace.  Therapeutic modalities: Cognitive Behavioral Therapy, Solution-Oriented/Positive Psychology, Environmental manager, and Faith-sensitive  Mental Status/Observations:  Appearance:   Casual     Behavior:  Appropriate  Motor:  Normal  Speech/Language:   Clear and Coherent  Affect:  Appropriate  Mood:  anxious  Thought process:  normal  Thought content:    WNL  Sensory/Perceptual disturbances:    WNL  Orientation:  Fully oriented  Attention:  Good    Concentration:  Good  Memory:  WNL  Insight:    Good  Judgment:   Good  Impulse Control:  Good   Risk Assessment: Danger to Self: No Self-injurious Behavior: No Danger to Others: No Physical Aggression / Violence: No Duty  to Warn: No Access to Firearms a concern: No  Assessment of progress:  stabilized  Diagnosis:   ICD-10-CM   1. Panic disorder with agoraphobia - stable  F40.01     2. Social anxiety disorder - stable  F40.10      3. Occupational problem  Z56.9     4. Financial difficulty  Z59.9     5. Spiritual problem  Z65.8      Plan:  Meds/nutrition Endorse maint Auvelity and weaning clonazepam  Advocate vitamin D Recommend Super B complex for nutritional balance, stress effects, and alertness Most likely recommend omega 3 for suspected cholesterol issue and general antiinflammation Option MCT and/or partial keto dieting to reduce carb sensitivity For winter depression, may ask adjustment of prescriber or hold and see what lifestyle and cognitive coping will do Seek concurrent advice about OTC testosterone booster -- possible adverse effects Sleep/energy melatonin timing about 2 hours before bedtime, as tolerated; may use amber glasses to help set internal clock PRN waking techniques -- light, water, chill, brisk awakening option to ask psychiatry about an overnight-release stimulant continue other measures PRN, consult on sleep hygiene PRN Exercise -- if not gym, find short exercise snacks, and limber up well Occupational/financial Endorse maintaining current combination of part-time industrial work Designer, television/film set) and self-employed (Patent examiner, 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  Option to train further on panic-control  techniques.  Cognitive -- Pay attention to conclusion-jumping thoughts and try to call time out.  Resolve to let things be possible, even if they are hard, or anxiety-provoking.  Admit not knowing what will go wrong.  On the job, use the 5 I can statements above.  Self-affirm progress and successes, confidences fulfilled. Emotional expression and support  Take wife up on the offer to hear out how he feels, what he's struggling with PRN let family know if well-intentioned support backfires or raises anxiety instead.  Option to include in therapy PRN. Practice actually sharing what's on his mind with wife, and give her clearance to not settle for fine or nothing Best to curfew phone surfing when together unless purposefully sharing phone input as together time, as long as it doesn't serve to avoid more vulnerable thoughts and feelings Other recommendations/advice -- As may be noted above.  Continue to utilize previously learned skills ad lib. Medication compliance -- Maintain medication as prescribed and work faithfully with relevant prescriber(s) if any changes are desired or seem indicated. Crisis service -- Aware of call list and work-in appts.  Call the clinic on-call service, 988/hotline, 911, or present to [facility] or ER if any life-threatening psychiatric crisis. Followup -- No follow-ups on file.  Next scheduled visit with me 06/14/2024.  Next scheduled in this office 06/14/2024.  Lamar Kendall, PhD Jodie Kendall, PhD LP Clinical Psychologist, [facility] [address] [place], [place] [address] [phone]

## 2024-06-14 ENCOUNTER — Ambulatory Visit (INDEPENDENT_AMBULATORY_CARE_PROVIDER_SITE_OTHER): Admitting: Psychiatry

## 2024-06-14 DIAGNOSIS — F4001 Agoraphobia with panic disorder: Secondary | ICD-10-CM

## 2024-06-14 DIAGNOSIS — Z599 Problem related to housing and economic circumstances, unspecified: Secondary | ICD-10-CM | POA: Diagnosis not present

## 2024-06-14 DIAGNOSIS — Z569 Unspecified problems related to employment: Secondary | ICD-10-CM | POA: Diagnosis not present

## 2024-06-14 DIAGNOSIS — F4010 Social phobia, unspecified: Secondary | ICD-10-CM
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
## 2024-07-19 ENCOUNTER — Ambulatory Visit (INDEPENDENT_AMBULATORY_CARE_PROVIDER_SITE_OTHER): Admitting: Psychiatry

## 2024-07-19 DIAGNOSIS — F4010 Social phobia, unspecified: Secondary | ICD-10-CM

## 2024-07-19 DIAGNOSIS — F4001 Agoraphobia with panic disorder: Secondary | ICD-10-CM
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
## 2024-08-16 DIAGNOSIS — F410 Panic disorder [episodic paroxysmal anxiety] without agoraphobia: Secondary | ICD-10-CM | POA: Diagnosis not present

## 2024-08-23 ENCOUNTER — Ambulatory Visit: Admitting: Psychiatry

## 2024-09-05 DIAGNOSIS — R070 Pain in throat: Secondary | ICD-10-CM | POA: Diagnosis not present

## 2024-09-05 DIAGNOSIS — R0981 Nasal congestion: Secondary | ICD-10-CM | POA: Diagnosis not present

## 2024-09-20 ENCOUNTER — Ambulatory Visit: Admitting: Psychiatry

## 2024-10-23 ENCOUNTER — Ambulatory Visit: Admitting: Psychiatry

## 2024-10-23 DIAGNOSIS — Z599 Problem related to housing and economic circumstances, unspecified: Secondary | ICD-10-CM

## 2024-10-23 DIAGNOSIS — Z569 Unspecified problems related to employment: Secondary | ICD-10-CM

## 2024-10-23 DIAGNOSIS — F4010 Social phobia, unspecified: Secondary | ICD-10-CM

## 2024-10-23 DIAGNOSIS — F4001 Agoraphobia with panic disorder: Secondary | ICD-10-CM | POA: Diagnosis not present

## 2024-10-23 DIAGNOSIS — F341 Dysthymic disorder: Secondary | ICD-10-CM

## 2024-10-23 NOTE — Progress Notes (Signed)
 Psychotherapy Progress Note [facility] 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  Financially intimidating time right now trying to figure out [place], at least for [employer] with her complex care needs, between lapsing subsidies, premium increases, Chad Burke's care in the [place] system and the most affordable plan not covering [facility].  Looking into an employer-sponsored plan at [employer], but Chad Burke's and Chad Burke's coverage seems impossibly expensive.  Housing is stymied, 4 people in an $16 2BR apt, can't afford to move out and buy, and Chad Burke (3 nearing 4) remains in parents' bedroom.  Also hard to figure where the money goes when they're making $90K now.  2 cars ailing but F just provided one.  And Chad Burke's student loan payments are set to go active again in January.    Thankful for everybody's health, Chad Burke flourishing in school, and son Chad Burke keeping a steady job making $50K as a production designer, theatre/television/film with Chad Burke.  Chad Burke is very much against asking him to contribute, Chad Burke torn -- doesn't want to activate his anxiety, but some money would help, and it's an adulting lesson to pay something for his room and board, but Chad Burke would feel guilty (having lived with his parents till 42).  Encouraged to consider different versions of ponying up something, partly because it is another kind of kindness to move his son toward realism with the wider world, where he would certainly have to pay something, and offered ideas like 1/4 of what his share of the rent would be  sharing a commercial 2BR with a friend, or 1/2 of a 1/4 share of their current rent ($100, what Chad Burke was thinking), or even charge and bank his payments to give back later with interest.  Parallel issues with Chad Burke planning ahead -- he has neglected to activate a 401k or save anything, apparently spending freely on girlfriend and his own comforts.  Encouraged in teaching and titrating good lessons an balancing his own budget best able.  Acknowledged that it's hard, and support provided re interpretations of societal powers making it harder to be financially secure and of lawmakers failing to fix electronic data systems crisis before we land in it in 2 weeks.  Feeling a conviction to get back involved in church, begin raising Chad Burke there, too.  Known difficulty reconciling what sort of church would suit Chad Burke.  Encouraged to seek compromise such that they both find there are positive values, a positive community, free enough of politicized preaching that both can be content, and both of them ready to accept some chance of running into disagreeable people.  At work, no further incidents with gossip and irritating behavior.  Did have a coworker pass away (heart attack working under his own car).  Sobering reminder, as is a spate of deadly news this weekend, though he still tries to manage stress by disconnecting largely from sensational news feed.  Therapeutic modalities: Cognitive Behavioral Therapy, Solution-Oriented/Positive Psychology, and Ego-Supportive  Mental Status/Observations:  Appearance:   Casual     Behavior:  Appropriate  Motor:  Normal  Speech/Language:   Clear and Coherent  Affect:  Appropriate  Mood:  Stressed, not depressed  Thought process:  normal  Thought content:    WNL  Sensory/Perceptual disturbances:    WNL  Orientation:  Fully oriented  Attention:  Good    Concentration:  Good  Memory:  WNL  Insight:    Good  Judgment:   Good  Impulse Control:  Good    Risk Assessment: Danger to Self: No Self-injurious Behavior: No Danger to Others: No Physical Aggression / Violence: No Duty to Warn: No Access to Firearms a concern: No  Assessment of progress:  stabilized  Diagnosis:   ICD-10-CM   1. Panic disorder with agoraphobia - stable  F40.01     2. Social anxiety disorder - stable  F40.10     3. Dysthymic disorder - controlled  F34.1     4. Occupational problem  Z56.9     5. Financial difficulty  Z59.9      Plan: Meds/nutrition Endorse maint Auvelity and weaning clonazepam  Advocate vitamin D Recommend Super B complex for nutritional balance, stress effects, and alertness Most likely recommend omega 3 for suspected cholesterol issue and general antiinflammation Option MCT and/or partial keto dieting to reduce carb sensitivity For winter depression, may ask adjustment of prescriber or hold and see what lifestyle and cognitive coping will do Seek concurrent advice about OTC testosterone booster -- possible adverse effects Sleep/energy melatonin timing about 2 hours before bedtime, as tolerated; may use amber glasses to help set internal clock PRN waking techniques -- light, water, chill, brisk awakening option to ask psychiatry about an overnight-release stimulant continue other measures PRN, consult on sleep hygiene PRN Exercise -- if not gym, find short exercise snacks, and limber up well Occupational/financial Endorse maintaining current combination of part-time industrial work designer, television/film set) and self-employed (patent examiner, 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  Option to train further on panic-control techniques.  Cognitive -- Pay attention to conclusion-jumping thoughts and try to call time out.  Resolve to let things be possible, even if they are hard, or anxiety-provoking.  Admit not knowing what will go wrong.  On the job, use the 5 I can statements above.  Self-affirm progress and successes, confidences fulfilled. Emotional expression and support  Take wife up on the offer to hear out how he feels, what he's struggling with PRN let family know if well-intentioned support backfires or raises anxiety instead.  Option to include in therapy PRN. Practice actually sharing what's on his mind with wife, and give her clearance to not settle for fine or nothing Best to curfew phone surfing when together unless purposefully sharing phone input as together time, as long as it doesn't serve to avoid more vulnerable thoughts and feelings Other recommendations/advice -- As may be noted above.  Continue to utilize previously learned skills ad lib. Medication compliance -- Maintain medication as prescribed and work faithfully with relevant  prescriber(s) if any changes are desired or seem indicated. Crisis service -- Aware of call list and work-in appts.  Call the clinic on-call service, 988/hotline, 911, or present to [facility] or ER if any life-threatening psychiatric crisis. Followup -- Return for time as already scheduled.  Next scheduled visit with me Visit date not found.  Next scheduled in this office Visit date not found.  Lamar Kendall, PhD Chad Kendall, PhD LP Clinical Psychologist, [facility] [address], [address] [place], [place] [address] [phone]

## 2024-12-04 ENCOUNTER — Ambulatory Visit: Admitting: Psychiatry

## 2025-01-03 ENCOUNTER — Ambulatory Visit: Admitting: Psychiatry
